# Patient Record
Sex: Female | Born: 1950 | Hispanic: No | Smoking: Former smoker
Health system: Southern US, Community
[De-identification: ages and names within clinical notes are randomized; demographics above are authoritative.]

## PROBLEM LIST (undated history)

## (undated) DIAGNOSIS — R0602 Shortness of breath: Secondary | ICD-10-CM

## (undated) DIAGNOSIS — F32A Depression, unspecified: Secondary | ICD-10-CM

## (undated) DIAGNOSIS — F329 Major depressive disorder, single episode, unspecified: Secondary | ICD-10-CM

## (undated) DIAGNOSIS — R030 Elevated blood-pressure reading, without diagnosis of hypertension: Secondary | ICD-10-CM

## (undated) DIAGNOSIS — Z972 Presence of dental prosthetic device (complete) (partial): Secondary | ICD-10-CM

## (undated) DIAGNOSIS — K219 Gastro-esophageal reflux disease without esophagitis: Secondary | ICD-10-CM

## (undated) DIAGNOSIS — IMO0001 Reserved for inherently not codable concepts without codable children: Secondary | ICD-10-CM

## (undated) DIAGNOSIS — Z973 Presence of spectacles and contact lenses: Secondary | ICD-10-CM

## (undated) DIAGNOSIS — D219 Benign neoplasm of connective and other soft tissue, unspecified: Secondary | ICD-10-CM

## (undated) DIAGNOSIS — J029 Acute pharyngitis, unspecified: Secondary | ICD-10-CM

## (undated) DIAGNOSIS — R21 Rash and other nonspecific skin eruption: Secondary | ICD-10-CM

## (undated) DIAGNOSIS — K635 Polyp of colon: Secondary | ICD-10-CM

## (undated) DIAGNOSIS — M1990 Unspecified osteoarthritis, unspecified site: Secondary | ICD-10-CM

## (undated) DIAGNOSIS — M8580 Other specified disorders of bone density and structure, unspecified site: Secondary | ICD-10-CM

## (undated) DIAGNOSIS — D649 Anemia, unspecified: Secondary | ICD-10-CM

## (undated) DIAGNOSIS — E119 Type 2 diabetes mellitus without complications: Secondary | ICD-10-CM

## (undated) DIAGNOSIS — I10 Essential (primary) hypertension: Secondary | ICD-10-CM

## (undated) HISTORY — DX: Benign neoplasm of connective and other soft tissue, unspecified: D21.9

## (undated) HISTORY — DX: Shortness of breath: R06.02

## (undated) HISTORY — DX: Presence of spectacles and contact lenses: Z97.3

## (undated) HISTORY — DX: Presence of dental prosthetic device (complete) (partial): Z97.2

## (undated) HISTORY — DX: Unspecified osteoarthritis, unspecified site: M19.90

## (undated) HISTORY — DX: Depression, unspecified: F32.A

## (undated) HISTORY — PX: ABDOMINAL HYSTERECTOMY: SHX81

## (undated) HISTORY — DX: Acute pharyngitis, unspecified: J02.9

## (undated) HISTORY — DX: Rash and other nonspecific skin eruption: R21

## (undated) HISTORY — DX: Essential (primary) hypertension: I10

## (undated) HISTORY — DX: Polyp of colon: K63.5

## (undated) HISTORY — DX: Elevated blood-pressure reading, without diagnosis of hypertension: R03.0

## (undated) HISTORY — DX: Reserved for inherently not codable concepts without codable children: IMO0001

## (undated) HISTORY — DX: Gastro-esophageal reflux disease without esophagitis: K21.9

## (undated) HISTORY — DX: Anemia, unspecified: D64.9

## (undated) HISTORY — DX: Other specified disorders of bone density and structure, unspecified site: M85.80

## (undated) HISTORY — DX: Major depressive disorder, single episode, unspecified: F32.9

## (undated) HISTORY — PX: ENDOMETRIAL ABLATION: SHX621

---

## 1998-10-26 ENCOUNTER — Encounter: Payer: Self-pay | Admitting: Emergency Medicine

## 1998-10-26 ENCOUNTER — Emergency Department (HOSPITAL_COMMUNITY): Admission: EM | Admit: 1998-10-26 | Discharge: 1998-10-27 | Payer: Self-pay | Admitting: Emergency Medicine

## 1999-09-22 ENCOUNTER — Other Ambulatory Visit: Admission: RE | Admit: 1999-09-22 | Discharge: 1999-09-22 | Payer: Self-pay | Admitting: Gynecology

## 1999-09-22 ENCOUNTER — Encounter (INDEPENDENT_AMBULATORY_CARE_PROVIDER_SITE_OTHER): Payer: Self-pay | Admitting: Specialist

## 2000-01-22 ENCOUNTER — Ambulatory Visit (HOSPITAL_COMMUNITY): Admission: RE | Admit: 2000-01-22 | Discharge: 2000-01-22 | Payer: Self-pay | Admitting: Gynecology

## 2000-01-22 ENCOUNTER — Encounter (INDEPENDENT_AMBULATORY_CARE_PROVIDER_SITE_OTHER): Payer: Self-pay | Admitting: Specialist

## 2000-11-02 ENCOUNTER — Other Ambulatory Visit: Admission: RE | Admit: 2000-11-02 | Discharge: 2000-11-02 | Payer: Self-pay | Admitting: Gynecology

## 2002-06-14 ENCOUNTER — Inpatient Hospital Stay (HOSPITAL_COMMUNITY): Admission: AD | Admit: 2002-06-14 | Discharge: 2002-06-15 | Payer: Self-pay | Admitting: Gynecology

## 2002-06-14 ENCOUNTER — Other Ambulatory Visit: Admission: RE | Admit: 2002-06-14 | Discharge: 2002-06-14 | Payer: Self-pay | Admitting: Gynecology

## 2002-06-15 ENCOUNTER — Encounter (INDEPENDENT_AMBULATORY_CARE_PROVIDER_SITE_OTHER): Payer: Self-pay

## 2002-06-28 ENCOUNTER — Encounter (INDEPENDENT_AMBULATORY_CARE_PROVIDER_SITE_OTHER): Payer: Self-pay | Admitting: Specialist

## 2002-06-28 ENCOUNTER — Inpatient Hospital Stay (HOSPITAL_COMMUNITY): Admission: RE | Admit: 2002-06-28 | Discharge: 2002-07-01 | Payer: Self-pay | Admitting: Gynecology

## 2003-07-24 ENCOUNTER — Encounter: Admission: RE | Admit: 2003-07-24 | Discharge: 2003-07-24 | Payer: Self-pay | Admitting: Gastroenterology

## 2003-07-24 DIAGNOSIS — K635 Polyp of colon: Secondary | ICD-10-CM

## 2003-07-24 HISTORY — DX: Polyp of colon: K63.5

## 2003-08-14 ENCOUNTER — Other Ambulatory Visit: Admission: RE | Admit: 2003-08-14 | Discharge: 2003-08-14 | Payer: Self-pay | Admitting: Gynecology

## 2003-08-15 ENCOUNTER — Encounter (INDEPENDENT_AMBULATORY_CARE_PROVIDER_SITE_OTHER): Payer: Self-pay | Admitting: Specialist

## 2003-08-15 ENCOUNTER — Ambulatory Visit (HOSPITAL_COMMUNITY): Admission: RE | Admit: 2003-08-15 | Discharge: 2003-08-15 | Payer: Self-pay | Admitting: Gastroenterology

## 2004-08-20 ENCOUNTER — Other Ambulatory Visit: Admission: RE | Admit: 2004-08-20 | Discharge: 2004-08-20 | Payer: Self-pay | Admitting: Gynecology

## 2005-11-03 ENCOUNTER — Other Ambulatory Visit: Admission: RE | Admit: 2005-11-03 | Discharge: 2005-11-03 | Payer: Self-pay | Admitting: Gynecology

## 2006-11-07 ENCOUNTER — Other Ambulatory Visit: Admission: RE | Admit: 2006-11-07 | Discharge: 2006-11-07 | Payer: Self-pay | Admitting: Gynecology

## 2007-11-13 ENCOUNTER — Encounter: Payer: Self-pay | Admitting: Gynecology

## 2007-11-13 ENCOUNTER — Other Ambulatory Visit: Admission: RE | Admit: 2007-11-13 | Discharge: 2007-11-13 | Payer: Self-pay | Admitting: Gynecology

## 2007-11-13 ENCOUNTER — Ambulatory Visit: Payer: Self-pay | Admitting: Gynecology

## 2007-12-11 ENCOUNTER — Ambulatory Visit: Payer: Self-pay | Admitting: Gynecology

## 2008-11-13 ENCOUNTER — Other Ambulatory Visit: Admission: RE | Admit: 2008-11-13 | Discharge: 2008-11-13 | Payer: Self-pay | Admitting: Gynecology

## 2008-11-13 ENCOUNTER — Encounter: Payer: Self-pay | Admitting: Gynecology

## 2008-11-13 ENCOUNTER — Ambulatory Visit: Payer: Self-pay | Admitting: Gynecology

## 2008-12-03 ENCOUNTER — Encounter: Admission: RE | Admit: 2008-12-03 | Discharge: 2008-12-03 | Payer: Self-pay | Admitting: Gynecology

## 2008-12-20 ENCOUNTER — Encounter: Admission: RE | Admit: 2008-12-20 | Discharge: 2008-12-20 | Payer: Self-pay | Admitting: Gastroenterology

## 2009-01-24 ENCOUNTER — Ambulatory Visit: Payer: Self-pay | Admitting: Gynecology

## 2009-02-13 ENCOUNTER — Ambulatory Visit: Payer: Self-pay | Admitting: Gynecology

## 2009-04-30 ENCOUNTER — Ambulatory Visit: Payer: Self-pay | Admitting: Gynecology

## 2009-05-05 ENCOUNTER — Ambulatory Visit: Payer: Self-pay | Admitting: Gynecology

## 2009-05-16 ENCOUNTER — Ambulatory Visit: Payer: Self-pay | Admitting: Gynecology

## 2010-03-20 ENCOUNTER — Other Ambulatory Visit (HOSPITAL_COMMUNITY)
Admission: RE | Admit: 2010-03-20 | Discharge: 2010-03-20 | Disposition: A | Discharge status: Home / Self Care | Payer: 59 | Source: Ambulatory Visit | Attending: Gynecology | Admitting: Gynecology

## 2010-03-20 ENCOUNTER — Other Ambulatory Visit: Payer: Self-pay | Admitting: Gynecology

## 2010-03-20 ENCOUNTER — Ambulatory Visit
Admission: RE | Admit: 2010-03-20 | Discharge: 2010-03-20 | Payer: Self-pay | Source: Home / Self Care | Attending: Gynecology | Admitting: Gynecology

## 2010-03-20 DIAGNOSIS — Z124 Encounter for screening for malignant neoplasm of cervix: Secondary | ICD-10-CM | POA: Insufficient documentation

## 2010-07-10 NOTE — Op Note (Signed)
[facility]  Patient:    Kelsey [name], Kelsey [name]                        MRN: [id number] Proc. Date: 01/22/00 Attending:  Gaetano Hawthorne. Lily Peer, M.D.                           Operative Report  PREOPERATIVE DIAGNOSES:       1. Menorrhagia.                               2. Iron-deficiency anemia.  POSTOPERATIVE DIAGNOSES:      1. Menorrhagia.                               2. Iron-deficiency anemia.  OPERATION:                    1. Endomyoresection intrauterine cavity.                               2. Endometrial ablation.  SURGEON:                      Juan H. Lily Peer, M.D.  ASSISTANT:  ANESTHESIA:                   General endotracheal anesthesia.  INDICATIONS:                  A 60 year old gravida 2, para 2 with menorrhagia and iron-deficiency anemia.  Preoperative evaluation to include benign endometrial biopsy, sonohistogram, and Pap smear; all normal.  The patient was not interested in sterilization. She stated that she is 59 years of age and has felt comfortable with her mode of contraceptive and was not wanting to undergo and take the risk of a laparoscopic surgery and was made aware that this is not any form of contraception per se. She could conceivably get pregnant or not at all.  FINDINGS:                     Normal fluff endometrium with normal-appearing tubal ostia and normal endocervical canal.  DESCRIPTION OF PROCEDURE:     After the patient was adequately counseled, she was taken to the operating room where she was placed in the lower lithotomy position.  The vagina and perineum were prepped and draped in the usual sterile fashion.  A red rubber Roxan Hockey was utilized to evacuate the bladder contents for approximately 50 cc.  The patient had a weighted speculum placed in the vagina. A single-tooth tenaculum was placed into the anterior cervical lip.  Of note, a laminaria that was placed the night before was removed, thus requiring little  cervical dilatation.  The [facility] operative resectoscope with a 90 degree wire loop was inserted into the intrauterine cavity and 3% Sorbitol was utilized as the distending medium and the [facility] generator was utilized with cutting mode of 80 watts and coagulation mode of 80 watts.  In a systematic fashion, endometrial strips were excised to the level of the myometrium in a circular fashion and the strips were passed off and submitted for histological evaluation.  This was done to the level of the internal os.  Once  this was completed, the 90 degree wire loop was removed and exchanged for a roller bar and the entire uterine cavity was ablated to a depth of 2 to 3 mm.  This was accomplished by ablating the entire endometrial cavity up to the internal cervical os.  Pre- and post procedure pictures were obtained.  A copy will be kept at [facility] and a second set at [facility] office in the patients record.  Fluid deficit was 90 cc and the patient received approximately 1300 cc of lactated Ringers.  She received a gram of Cefotan for prophylaxis.  She was transferred to the recovery room with stable vital signs. DD:  01/22/00 TD:  01/22/00 Job: [id number]

## 2010-07-10 NOTE — Discharge Summary (Signed)
   [name], Kelsey [name]                           ACCOUNT NO.:  [id number]   MEDICAL RECORD NO.:  [id number]                   PATIENT TYPE:  INP   LOCATION:  9124                                 FACILITY:  [facility]   PHYSICIAN:  Juan H. Lily Peer, M.D.             DATE OF BIRTH:  [birth year]   DATE OF ADMISSION:  06/28/2002  DATE OF DISCHARGE:  07/01/2002                                 DISCHARGE SUMMARY   DISCHARGE DIAGNOSES:  1. Leiomyomatous uteri.  2. Menometrorrhagia.  3. Anemia.  4. Pelvic pain.  5. Pelvic adhesions.  6. Status post total abdominal hysterectomy with bilateral salpingo-     oophorectomy.  7. Pelvic adenolysis by Gaetano Hawthorne. Lily Peer, M.D. on Jun 28, 2002.   HISTORY:  The patient is a 60 year old female gravida 2, para 2 with a  history of severe menorrhagia and symptomatic anemia to the point actually  was admitted for IV Premarin and emergency D&C and transfusion of 2 units of  packed red blood cells on June 14, 2002.  She was discharged on Megace and  to the point that her hemoglobin could be sufficient enough to perform  definitive surgery.  Therefore, patient was admitted.   HOSPITAL COURSE:  On Jun 28, 2002 patient was admitted.  Underwent a total  abdominal hysterectomy, bilateral salpingo-oophorectomy with pelvic  adenolysis by Gaetano Hawthorne. Lily Peer, M.D. without complications.  Postoperatively the patient remained afebrile, voiding, stable condition.  She was discharged to home on Jul 01, 2002 and given [facility]necology  instructions.   ACCESSORY CLINICAL FINDINGS:  Laboratories:  On Jun 29, 2002 hemoglobin 9.9.   DISPOSITION:  The patient is discharged to home.  Given prescription for  Lortab 7.5/500, Climara 0.1 mg patch q. weekly, Motrin 800 mg p.r.n., Nu-  Iron 150 daily.  She is to follow up in the office in three weeks.  If any  problem prior to that time to be seen in the office.     Susa Loffler, P.A.                    Juan H. Lily Peer,  M.D.    TSG/MEDQ  D:  08/03/2002  T:  08/03/2002  Job:  [id number]

## 2010-07-10 NOTE — H&P (Signed)
Kelsey [name], Kelsey [name]                           ACCOUNT NO.:  [id number]   MEDICAL RECORD NO.:  [id number]                   PATIENT TYPE:  INP   LOCATION:  NA                                   [facility]   PHYSICIAN:  Juan H. Lily Peer, M.D.             DATE OF BIRTH:  September 24, 1950   DATE OF ADMISSION:  DATE OF DISCHARGE:                                HISTORY & PHYSICAL   NOTE:  The patient is scheduled for surgery tomorrow, May 06th at 12:30 here  at [facility].   CHIEF COMPLAINT:  1. Menometrorrhagia.  2. Iron-deficiency anemia.  3. Leiomyomatous uteri.   HISTORY:  The patient is a 60 year old gravida 2, para 2 who has been  complaining for several months of severe menometrorrhagia and as a result  has had symptomatic anemia; and, ultrasounds and exams demonstrate  leiomyomatous uteri.  The patient has had an endomyoresection and  endometrial ablation back in November 2002 secondary to menometrorrhagia.  She has been on iron supplementation all through that time.  At the time of  her annual examination she was found to have an irregular size uterus,  approximately eight to 10 weeks size.  Her ultrasound in 2002 demonstrated a  uterus measuring 10.8 x 5.4 x 6.7 cm; unable to identify the uterine cavity,  but there was a 23 x 14 mm cystic solid mass, intramural myomas measuring 27  x 23 mm and 15 x 10 mm respectively, and subserosal myomas measuring 27 x 27  mm, 18 x 17 mm and 20 x 19 mm respectively.  There was a heterogenous mass  in the area of the endometrial cavity 1 mm versus 13.6 mm with increased  Doppler.  The right ovary had a complex cystic mass measuring 23 x 16 x 16  mm and thick-walled.  The left ovary was normal and there was minimal fluid  in the cul-de-sac.  At the time of the endomyoresection the pathology report  came back benign endometrium with basilar endometrium and no hyperplasia or  malignancy identified.   The patient had been  moving back and forth from her home country in [place]  to take care of a family situation and she was not seen back in the office  until March 09th of this year.  She has been complaining of being tired and  fatigued with menometrorrhagia, lack of energy, her periods being very, very  heavy, and she complains of vasomotor symptoms.  She has had prior anemia  and workup has demonstrated it to be iron-deficiency in nature.  Her uterus  continues to be somewhat enlarged now, measuring 10-12 weeks size.  A CBC  that was done on March 09th demonstrated a hemoglobin of 9.6 with a platelet  count of 314,000.  Her prolactin, FSH and TSH have been reportedly normal.   We had discussed that based on her age she would benefit  from proceeding  with a hysterectomy.  She had been placed on Megace 20 mg b.i.d. along with  iron tablets b.i.d. in and effort to stop her bleeding and get her  hemoglobin to a safe range before proceeding with abdominal hysterectomy.  The patient has had previous cesarean sections in the past.   The uterus most recently by ultrasound demonstrated hat it is now 11.6 x 7.2  x 7.4 cm and multiple myomata as described above.  The endometrium was  difficult to identify, except for from the myometrium.  The right ovary had  a thin-walled echo-free cyst.  The previous complex right ovarian cyst was  no longer present.  The left ovary had an echo-free cystic mass seen  transvaginally measuring 17 x 15 x 17 mm.  The cul-de-sac was negative.  An  attempt in the office to proceed with a vaporization was unsuccessful due to  the discomfort level the patient experienced and only an endometrial biopsy  was obtained.  At endometrial biopsy in the office, although somewhat  limited, demonstrated secretory endometrium with features of exogenous  progestational affect associated with degenerative changes consistent with  breakdown bleeding, but no hyperplasia or malignancy was identified.   Due to  he fact her hemoglobin was low and she was hypotensive, had lightheadedness  and was fatigued it was decided to admit her to the hospital, which she was  on April 22nd whereby she received Premarin IV and received two units of  packed red blood cells.  The following morning she underwent a D&E of which  the path report demonstrated endometrium with exogenous progestational  affect associated with extensive degenerative changes consistent with  breakthrough bleeding.  The changes were also described a suggesting benign  endometrial polyps as well.  The patient was released home on her Megace and  iron tablets, and scheduled for definitive surgery such as a hysterectomy,  which is scheduled for tomorrow, May 06th at 12:30 P.M.   PAST MEDICAL HISTORY:  The patient has anemia.  She takes calcium 1200-1500  mg daily.  She takes iron tablets 1 tablet twice a day after a blood  transfusion.  Of note, she had a hemoglobin of 11.3 and a hematocrit of  34.8, and platelet count of 314,000.  She also has a history of  endomyoresection and endometrial ablation in 2001 for menometrorrhagia.  The  patient has had previous cesarean sections and some form of nasal  reconstructive surgery.   ALLERGIES:  The patient denies any allergies.   FAMILY HISTORY:  Father with hypertension.  Mother with cardiovascular  disease.   SOCIAL HISTORY:  The patient does smoke approximately a half pack of  cigarettes daily.   PHYSICAL EXAMINATION:  GENERAL:  Well-developed, well-nourished female.  HEENT:  Unremarkable with the exception of the sclerae, which appeared to be  pale in nature before her admission to the hospital and before her blood  transfusion.  LUNGS:  Clear to auscultation without rhonchi or wheezes.  HEART:  Regular rate and rhythm at approximately 90 beats per minute.  BREAST EXAMINATION:  No palpable mass or tenderness. LYMPH NODES: No supraclavicular or axillary lymphadenopathy.   ABDOMEN: Soft, nontender without rebound or guarding.  PELVIC:  Bartholin, urethra and Skene glands within normal limits. Blood is  noted to be present in the vaginal vault extruding through the cervical os  when she was admitted prior to the [facility].  Uterus is approximately 10-12 weeks  size, irregularly shaped.  RECTAL EXAMINATION:  No done.   ASSESSMENT:  [age]-year-old gravida 2 para 2 with longstanding history  of menometrorrhagia having had a previous endomyoresection in 2001 who  suffers from anemia, enlarging uterus attributed to leiomyomata uteri with  benign endometrial biopsy suspicious for endometrial polyps along with  leiomyoma.  The patient has been on Megace 20 mg b.i.d. along with iron  supplementation b.i.d.  She recently underwent Premarin intravenously as  well as transfusion of two units of packed red blood cells on April 22nd,  subsequently she underwent D&C to temporarily stop her bleeding in an effort  to prepare herself in order to get her hemoglobin at an adequate level to  proceed with a hysterectomy.   The patient was counseled for a total abdominal hysterectomy with bilateral  salpingo-oophorectomy.  Risks, benefits, pros and cons of the operation were  discussed including infection, bleeding and trauma to internal organs.  For  DVT prophylaxis she will have pneumatic compression stockings.  To prevent  infection she will receive prophylactic antibiotic.  In the event of  uncontrollable hemorrhage the patient is aware in that event she will need a  blood transfusion.  With this she is at an increased risk for anaphylactic  reaction, hepatitis and/or AIDS.  Also, there is a risk for trauma to  internal organs, i.e. the bladder, intestines and blood vessels, and nerves  and nearby structures, for which she may need corrective surgery.  There is  a remote possibility that this could be a leiomyosarcoma, the final path  report will determine this or if there is  any malignancy, and at that point  she will need to be referred to a Gyn oncologist for more definitive  surgery.   All these issues were discussed with the patient in Spanish.  She is fully  aware, also, that she will need to be placed on hormone replacement therapy.  Again, all the above was discussed with the patient in Spanish and all  questions were answered, and we will follow accordingly.                                                 Juan H. Lily Peer, M.D.    JHF/MEDQ  D:  06/27/2002  T:  06/28/2002  Job:  [id number]

## 2010-07-10 NOTE — H&P (Signed)
[facility]  Patient:    Kelsey [name], Kelsey [name]                        MRN: [id number] Adm. Date:  01/21/00 Attending:  Gaetano Hawthorne. Lily Peer, M.D.                         History and Physical  CHIEF COMPLAINT:              1. Menorrhagia.                               2. Iron deficiency anemia related to #1.  HISTORY OF PRESENT ILLNESS:   The patient is a 60 year old gravida 2, para 2 not using any form of contraception with the exception that her husband is using condoms and she has been using this method for 7 years which has been affective for her. She was seen on June 10 for her annual exam this year and was complaining of menorrhagia. She also was noted to have iron deficiency anemia. Last year her hemoglobin and hematocrit were 9.4 and 29.5 respectively and after anemia workup identified that it was iron deficiency was placed on supplemental iron 1 tablet twice daily. The day of her visit on June 10, her hemoglobin and hematocrit were 10.0 and 30.5 respectively. Her platelet count 328,000. The patient underwent a sonohysterogram and endometrial biopsy on July 31 which demonstrated a benign endometrium. The sonohysterogram demonstrated a uterus measuring 10.3 x 5.9 x 7.5 cm with an endometrial stripe of 4.3. mm. She had multiple intramural fibroids less than 20 mm in size. Both ovaries were normal size and the cul-de-sac was negative for any fluid. Saline infusion was done and a sonohysterogram was negative. No intracavitary defects were seen. The patient had been presented with various options, due to the fact that she is 60 years of age that she may want to consider having an endometrial oblation and concurrently a tubal sterilization procedure. When she was seen in the office today, November 29, she stated that she would not like to have a laparoscopic sterilization procedure and that her form of contraception, condoms, has worked well for the past 7  years and since she is approaching menopause she would not like to have anything else done but the endometrial oblation. If this were to fail in the future, she stated that she would consider a hysterectomy. In an effort to suppress her bleeding and to improve her hemoglobin, she was placed in a ______ analog such as Lupron 3.75 mg for a 3 month period and also the addition of Megace last month to stop all her bleeding all together. CBC is pending at the time of this dictation as part of her preop laboratory. She was seen in the office today, November 29, whereby she had a laminary placed intracervically to facilitate the insertion of the cystoscope the day of her surgery.  PAST MEDICAL HISTORY:         The patient is a chronic smoker. She had a chest x-ray PA and lateral last year which was negative. She also had a bone density study last year which was also normal as well as her mammogram. Her menarche was at the age of 25. She has had 2 prior cesarean sections in her native country of [place] and she has had some form of nose reconstructive  surgery. She is a heavy smoker. She drinks caffeine regularly. Her sister has a history of heart disease and her father has a history of hypertension. She has 4 brothers and 4 sisters.  PHYSICAL EXAMINATION:  GENERAL:                      Well-developed, well-nourished female.  HEENT:                        Unremarkable.  NECK:                         Supple. Trachea midline. No carotid bruits, no thyromegaly.  LUNGS:                        Clear to auscultation without rhonchi or wheezes. HEART:                        Regular rate and rhythm, no murmur or gallops.  BREASTS:                      Done at the time of her annual exam on June 10 of this year was normal.  ABDOMEN:                      Soft, nontender without rebound or guarding.  PELVIC:                       Bartholins, urethra and Skene glands within normal limits. Vagina and  cervix no gross lesion on inspection. Uterus 8-10 week size, irregular. Adnexa no mass or tenderness.  RECTAL:                       Unremarkable.  Hemoccult negative.  ASSESSMENT:                   A 60 year old gravida 2, para 2 with menorrhagia and iron deficiency anemia was placed on ______ analog Lupron 3.75 mg for 3 months in an effort to suppress uterine stimulation and improve the patients hemoglobin. She was also on supplemental iron 1 tablet twice daily. She was seen in the office on November 29 where the laminary was placed intracervically to be removed the day of her surgery to facilitate hysteroscopic exertion. Once again we discussed the risks, benefits, pros and cons of the procedure to include infection, bleeding, trauma to internal organs, perforation of the uterus requiring open exploration for corrective surgery, also the risk of fluid overload, pulmonary embolism and even death. Also in the event of hemorrhage if she were need to be a blood transfusion, potential risk or anaphylactic reaction, hepatitis and aids. We discussed that endometrial oblation cannot guarantee contraception nor fertility that individuals usually have this procedure their spouses had a vasectomy or the patient would have tubal sterilization procedure. Initially, she had thought about having it done but she states today on preoperative consultation that she has had a method that works well for her "for 7 years" with the condoms and she is approaching menopause and she does not want to take the chance of any intra-abdominal trauma risk with laparoscopic technique as she has had 2 cesarean sections in the past. The patient was made fully aware of the potential risk and perhaps the underlying malignancy which was  not identified preoperatively but could manifest later in life. Also the possibility of needing to repeat this procedure several years down the line. All of this was explained to the  patient and her husband in Spanish and literature previously had been provided. All questions were answered and will follow accordingly. Of note, as part of her workup also she has had a TSH and prolactin recently this  year which was also normal.  PLAN:                         The patient is scheduled for endometrial oblation on the morning of November 30 in [facility].    DD: 01/21/00 TD:  01/22/00 Job: [id number]

## 2010-07-10 NOTE — Discharge Summary (Signed)
Kelsey [name], Kelsey [name]                           ACCOUNT NO.:  [id number]   MEDICAL RECORD NO.:  [id number]                   PATIENT TYPE:  INP   LOCATION:  9132                                 FACILITY:  [facility]   PHYSICIAN:  Juan H. Lily Peer, M.D.             DATE OF BIRTH:  Oct 18, 1950   DATE OF ADMISSION:  06/14/2002  DATE OF DISCHARGE:  06/15/2002                                 DISCHARGE SUMMARY   DISCHARGE DIAGNOSES:  1. Severe menometrorrhagia.  2. Symptomatic anemia.  3. Leiomyomata uteri.  4. Status post transfusion of 2 units of packed red blood cells secondary to     severe anemia.  5. Emergency dilatation and curettage by Gaetano Hawthorne. Lily Peer, M.D. on June 14, 2002.   HISTORY:  This is a 60 year old female gravida 2, para 2 with a history of  endometrial ablation November of 2001 secondary to menometrorrhagia.  She  was known to have a fibroid uterus.  Was not seen, however, for check/annual  examination from 2002 until March 2004.  Hemoglobin was demonstrated to be  9.6 at that time and she was placed on Megace 20 mg b.i.d. in effort to stop  her bleeding and to take iron b.i.d. in effort to get her iron level in safe  range to proceed with hysterectomy.  She returned to the office on June 13, 2002.  Uterus measured 11.6 x 7.2 x 7.4 cm with multiple myomas.  Iron level  decreased to 8.9.  __________ was attempted but unfortunately unable to be  successful secondary to discomfort so a __________ was ordered.  Due to the  fact the patient was lightheaded, symptomatic, chronic fatigue, and the  amount of bleeding she was having on the day of admission, she was admitted  to the hospital for Premarin IV and transfusion of 2 packed RBCs in  preparation for in the a.m. to a D&E.   HOSPITAL COURSE:  On June 14, 2002, patient was admitted with a diagnosis  of leiomyomatous uteri, menorrhagia, severe anemia secondary to same.  Was  begun on IV Premarin and was transfused 2  units of packed RBCs secondary to  severe anemia that she had.  She had been kept n.p.o. from admission and on  June 15, 2002, patient underwent an emergency dilatation and curettage  without complications and subsequently the bleeding was decreased.  Therefore, patient was felt stable for discharge.   ACCESSORY CLINICAL FINDINGS:  Laboratories:  On pathology endometrioma was  with exogenous progesterone effect __________ degenerative change consistent  with __________ benign endometrial polyp.  On June 15, 2002, at 0510  hemoglobin 11.3.   DISPOSITION:  The patient is discharged to home.  She is to continue Megace  20 mg b.i.d. and iron b.i.d.  She is to schedule TAH/BSO in the next one to  three weeks.     Susa Loffler, P.A.  Juan H. Lily Peer, M.D.    TSG/MEDQ  D:  08/03/2002  T:  08/03/2002  Job:  [id number]

## 2010-07-10 NOTE — H&P (Signed)
[name], [name]                           ACCOUNT NO.:  [id number]   MEDICAL RECORD NO.:  [id number]                   PATIENT TYPE:  INP   LOCATION:  9132                                 [facility]ACILITY:  [facility]   PHYSICIAN:  Juan H. Lily Peer, M.D.             DATE OF BIRTH:  1950/03/18   DATE OF ADMISSION:  06/14/2002  DATE OF DISCHARGE:                                HISTORY & PHYSICAL   CHIEF COMPLAINT:  1. Severe menometorrhagia.  2. Symptomatic anemia.  3. Leiomyomata uteri.   HISTORY:  The patient is a 60 year old gravida 2, para 2 who was last seen  for annual gynecological examination in 2002.  She has had history in the  past of endomyal resection, endometrial ablation back in 11/01 secondary to  menometorrhagia.  She had been on iron supplementation all through that  time.  At the time of her annual exam she was found to have an irregular  sized uterus, approximately 8-10 weeks size.  Her ultrasound that was done  in 2002 demonstrated a uterus measuring 10.8 x 5.4 x 6.7 cm, unable to  identify the uterine cavity but there was a 23 x 14 mm cystic solid mass,  intramural myomas measuring 27 x 23 mm and 15 x 10 mm respectively, with  subserosal myomas measuring 27 x 27 mm, 18 x 17 mm and 20 x 19 mm.  Heterogenous mass in the area of the endometrial cavity, 1 mm versus 13.6 mm  with increased Doppler.  Her right ovary had a complex cystic mass measuring  23 x 16 x 16 mm thick wall, the left ovary was normal and there was minimal  fluid in the cul-de-sac.  At that time of the endomyal ablation, she had an  endomyal resection on 01/22/00 with benign endometrium and basilar  endometrium and no hyperplasia malignancy was identified.  The patient had  been moving back and forth from here to [place] to take care of situations  at home and we did not see her until most recently which was 05/01/02.  She  has had multiple complaints, being tired and fatigued with irregular periods  and  lack of energy.  Her periods are very, very heavy and she had complaint  of visual motor symptoms.  She has had a prior anemia work and she had iron-  deficiency anemia.  Her uterus was somewhat enlarged measuring 10-12 weeks  size and a CBC that was done on 05/01/02 had demonstrated a hemoglobin of 9.6  with platelet count 314,000, prolactin, FSH and TSH were normal.  We had  discussed that based on her age that she would benefit from proceeding then  with a hysterectomy.  She had been placed on Megace 20 mg b.i.d. in an  effort to stop her bleeding, continue to take iron tablets one twice a day  in effort to get her iron level to a safe range  before we proceeded with a  hysterectomy.  She was asked to return to the office for an ultrasound which  she did yesterday.  The uterus measured 11.6 x 7.2 x 7.4 cm with multiple  myomas as described before.  The endometrium was difficult to identify  separate from the myometrium.  The right ovary had a thin wall echo free  cyst.  The previous complex right ovarian cyst was no longer present.  The  left ovary had an echo free cystic mass seen transvaginally measuring 17 x  15 x 17 mm.  The cul-de-sac was negative.  An attempt was made in the office  but she was bleeding today significantly, and her iron level today was noted  to have decreased to a level of 8.9, a vapor aspiration was attempted  without creating much discomfort for the patient so a Pipelle was utilized  and tissue was submitted for histological evaluation.  Due to the fact that  she was light headed, symptomatic, crying, fatigued and the amount of  bleeding she was having today, she is going to be admitted to the hospital  for Premarin intravenously and for a blood transfusion for two units of  packed red blood cells and preparation to follow for tomorrow morning to  proceed with a D&E and schedule for hysterectomy some time in the next week  if she were to continue to be stable.    PAST MEDICAL HISTORY:  1. Anemia.  She takes calcium 1200 to 1500 mg daily.  She is on iron tablets     one tablet twice a day.  2. She has history of endomyal resection and endometrial ablation in 2001     for menometrorrhagia.  She has had two previous cesarean sections and has     had some form of nasal reconstructive surgery.   FAMILY HISTORY:  A father with hypertension, mother with cardiovascular  disease.   SOCIAL HISTORY:  She does smoke approximately half a pack of cigarettes  daily.   PHYSICAL EXAMINATION:  GENERAL: Pale appearing female.  HEENT: Unremarkable with the exception that the sclerae was much paler.  LUNGS: Clear to auscultation without rhonchi or wheezes.  HEART: Regular rate and rhythm, heart rate approximately 90 beats per  minute.  BREAST EXAM: No palpable mass or tenderness.  LYMPH NODES: No supraclavicular or axillary lymphadenopathy.  ABDOMEN: Soft, nontender without rebound or guarding.  PELVIC: Bartholin, urethral skin glans within normal limits.  Blood present  in the vaginal vault and extruding through the cervical os.  Uterus  approximately 10 to 12 weeks size, irregular shape.  RECTAL EXAM: Not done.   ASSESSMENT:  60 year old gravida 2, para 2 with long-standing history of  menometorrhagia despite having had an endomyal resection and endometrial  ablation in 2001.  She has been on supplemental iron and now had been on  Megace which she has taken sometimes not two tablets a days as previously  prescribed because she gets pruritus, but at times will just take one.  Her  hemoglobin has dropped to a level of 8.9 for which she is symptomatic.  Her  platelet count was normal.   PLAN:  Admit her to the hospital for intravenous Premarin 25 mg IV q.4h for  four to six doses.  Will also transfuse her two units of packed red blood  cells and plan on doing a more vigorous vapor aspiration/DNE in the operating room and allow her hemoglobin to stabilize and  plan for a  total  abdominal hysterectomy and bilateral salpingo-oophorectomy tomorrow.  The  patient was counseled as to the potential risks from blood transfusion to  include anaphylactic reactions, hepatitis and AIDS and also perforation of  the uterus at the time of the D&E/vapor aspiration.  If the above regimen is  not successful in stopping her bleeding then we may need to emergently  proceed with hysterectomy at the same time as transfusion also.  All these  issues were discussed with the patient and her husband in Spanish and all  questions were answered and will follow accordingly.                                               Juan H. Lily Peer, M.D.    JHF/MEDQ  D:  06/14/2002  T:  06/15/2002  Job:  [id number]

## 2010-07-10 NOTE — Op Note (Signed)
NAMESOBIA, Kelsey [name]                           ACCOUNT NO.:  [id number]   MEDICAL RECORD NO.:  [id number]                   PATIENT TYPE:  INP   LOCATION:  9305                                 [facility]ACILITY:  [facility]   PHYSICIAN:  Juan H. Lily Peer, M.D.             DATE OF BIRTH:  07-Oct-1950   DATE OF PROCEDURE:  06/28/2002  DATE OF DISCHARGE:                                 OPERATIVE REPORT   PREOPERATIVE DIAGNOSES:  1. Symptomatic leiomyomata uteri.  2. Menometrorrhagia.  3. Anemia.  4. Pelvic pain.   POSTOPERATIVE DIAGNOSES:  1. Symptomatic leiomyomata uteri.  2. Menometrorrhagia.  3. Anemia.  4. Pelvic pain.  5. Pelvic adhesions.   PROCEDURE:  Total abdominal hysterectomy with bilateral salpingo-  oophorectomy and pelvic adhesiolysis.   SURGEON:  Juan H. Lily Peer, M.D.   ASSISTANT:  Rande Brunt. Eda Paschal, M.D.   ANESTHESIA:  General endotracheal anesthesia.   INDICATION FOR PROCEDURE:  A 60 year old gravida 2, para 2, with chronic  menometrorrhagia, iron-deficiency anemia, last week had undergone a 24-hour  admission to the hospital whereby she was given Premarin IV and transfused  two units of packed red blood cells, underwent a D&C to control her  bleeding.  She had continued on the Megace twice a day at 20 mg and iron two  tablets daily until today's surgery.  Her preoperative hemoglobin and  hematocrit were 13.2 and 40.6, respectively.   FINDINGS:  Leiomyomata uteri, pelvic adhesions, left ovarian endometriosis,  and a normal right ovary and normal-appearing appendix.   DESCRIPTION OF PROCEDURE:  After the patient was adequately counseled she  was taken to the operating room, where she underwent a successful general  endotracheal anesthesia.  She had received 1 g of Cefotan prophylactically.  A Foley catheter had been inserted in an effort to monitor urinary output  intraoperatively as well as for postoperative monitoring.  The abdomen and  vagina were prepped and  draped in the usual sterile fashion.  The drapes  were in place.  A Pfannenstiel skin incision was made adjacent to the  previous cesarean section scar.  The incision was carried down through the  skin and subcutaneous tissue down to the rectus fascia, whereby a midline  nick was made, the fascia was incised in a transverse fashion, and the  peritoneal cavity was entered.  The patient was placed in slight  Trendelenburg position after the O'Connor-O'Sullivan retractors were in  place.  Inspection of the pelvic cavity demonstrated leiomyomata uteri  whereby she had intramural subserosal leiomyoma evident, the left ovary had  a speckle of endometriosis, the right ovary looked fine, and in the lower  uterine segment there appeared to be some thickening of the bladder from  previous cesarean section.  Both round ligaments were identified and  transected and incised.  The broad ligaments were incised to the level of  the internal cervical os.  Both ureters were inspected  and with the use of  the surgeon's finger, the posterior broad ligament was penetrated and the  right infundibulopelvic ligament was clamped, cut, and free-tied with 0  Vicryl, followed by transfixion stitch.  A similar procedure was carried out  on the contralateral side.  Skeletonization of the peritoneum whereby the  broad and cardinal ligaments were serially clamped, cut, and suture ligated  to a level of both vaginal fornices was accomplished.  Once both angles were  secured, the cervix was excised from the vagina and the cervix, uterus,  tubes, and ovaries were passed off the operative field.  Both angle stitches  were secured with a transfixion stitch of 0 Vicryl suture and the remaining  vaginal cuff was secured with an interrupted figure-of-eight 0 Vicryl  suture.  The pelvic cavity was copiously irrigated with normal saline  solution.  After ascertaining adequate hemostasis the sponge count and  needle count were  correct.  The O'Connor-O'Sullivan retractors and sponges  were removed.  The visceral peritoneum was now reapproximated.  The rectus  fascia was closed with a running stitch of 0 Vicryl suture.  The  subcutaneous layers were Bovie cauterized.  The skin was reapproximated with  skin clips, followed by placement of Xeroform gauze and 4 x 8 dressing.  The  patient was transferred to the recovery room with stable vital signs.  Blood  loss for the procedure was recorded to be 250 mL.  Urine output 450 mL and  clear.  IV fluids:  2 L of lactated Ringer's.  Prophylactic antibiotics  consisted of 1 g of Cefotan IV, and she had pneumatic compression stockings  for DVT prophylaxis during the case as well.                                               Juan H. Lily Peer, M.D.    JHF/MEDQ  D:  06/28/2002  T:  06/29/2002  Job:  [id number]

## 2010-07-10 NOTE — Op Note (Signed)
   [name], Kelsey [name]                           ACCOUNT NO.:  [id number]   MEDICAL RECORD NO.:  [id number]                   PATIENT TYPE:  INP   LOCATION:  9132                                 [facility]   PHYSICIAN:  Juan H. Lily Peer, M.D.             DATE OF BIRTH:  1951/01/07   DATE OF PROCEDURE:  06/14/2002  DATE OF DISCHARGE:                                 OPERATIVE REPORT   SURGEON:  Gaetano Hawthorne. Lily Peer, M.D.   INDICATIONS:  A 60 year old with menometrorrhagia admitted last night  secondary to severe anemia and menorrhagia.  She was started on Premarin IV  25 mg IV q.4h. for four doses and she was transfused 2 units of packed red  blood cells.  Her preoperative hemoglobin was 8.9.  Post transfusion was  11.3.  The patient to undergo D&E and scheduled for hysterectomy in the next  couple of weeks.   PREOPERATIVE DIAGNOSES:  1. Myomatous uteri.  2. Menometrorrhagia.  3. Anemia.   POSTOPERATIVE DIAGNOSES:  1. Myomatous uteri.  2. Menometrorrhagia.  3. Anemia.   ANESTHESIA:  General endotracheal anesthesia.   PROCEDURE PERFORMED:  Emergency dilatation and curettage.   DESCRIPTION OF OPERATION:  After the patient was adequately counseled she  was taken to the operating room where she underwent a successful general  endotracheal anesthesia.  Her bladder was evacuated of its contents.  Vagina  and perineum were prepped and draped in usual sterile fashion.  A Graves  speculum was inserted into the vaginal vault.  The anterior cervical lip was  grasped with a single tooth tenaculum.  Of note, on examination under  anesthesia there was noted to be an 8-10 week sized irregularly-shaped  anteverted uterus.  The cervix was serially dilated and followed by vigorous  curettage in endometrial cavity interchanged with a suction curette.  Curetting submitted for histological evaluation.  The patient was extubated,  transferred to recovery room with stable vital signs.  She did  receive 1 g  of Cefotan for prophylaxis preoperatively.  In-and-out catheter for her  urine preoperatively was approximately 150 mL of urine.  Blood loss was  approximately less than 100 mL.  Fluid resuscitation consisted of 1300 mL of  lactated Ringer's.  The patient received 30 mg of Toradol prior to  extubation and transfer into the recovery room.  When she was transferred  she was with stable vital signs.                                               Juan H. Lily Peer, M.D.    JHF/MEDQ  D:  06/15/2002  T:  06/15/2002  Job:  [id number]

## 2010-07-10 NOTE — Op Note (Signed)
NAME:  Kelsey [name], Kelsey [name]                         ACCOUNT NO.:  [id number]   MEDICAL RECORD NO.:  [id number]                   PATIENT TYPE:  AMB   LOCATION:  ENDO                                 FACILITY:  [facility]   PHYSICIAN:  Graylin Shiver, M.D.                DATE OF BIRTH:  19-May-1950   DATE OF PROCEDURE:  08/15/2003  DATE OF DISCHARGE:                                 OPERATIVE REPORT   PROCEDURE PERFORMED:  Colonoscopy.   INDICATIONS FOR PROCEDURE:  Screening.   Informed consent was obtained after explanation of the risks of bleeding,  infection, and perforation.   PREMEDICATIONS:  Fentanyl 65 mcg  IV, Versed 7.5 mg IV.   DESCRIPTION OF PROCEDURE:  With the patient in the left lateral decubitus  position, a rectal exam was performed and no masses were felt.  The Olympus  colonoscope was inserted into the rectum and advanced around the colon to  the cecum.  Cecal landmarks were identified.  The cecum and ascending colon  were normal.  The transverse colon normal.  The descending colon showed a  small 3 mm polyp biopsied off with cold forceps.  The sigmoid and rectum  looked normal.  The patient tolerated the procedure well without  complications.   IMPRESSION:  Small descending colon polyp.   PLAN:  The pathology will be checked.                                               Graylin Shiver, M.D.    [name]  D:  08/15/2003  T:  08/16/2003  Job:  [id number]   cc:   [name]. [name], M.D.  [address]  [name]  [place] [address]  Fax: [phone]

## 2010-08-25 ENCOUNTER — Other Ambulatory Visit: Payer: Self-pay | Admitting: Family Medicine

## 2010-08-25 DIAGNOSIS — D179 Benign lipomatous neoplasm, unspecified: Secondary | ICD-10-CM

## 2010-09-04 ENCOUNTER — Ambulatory Visit (HOSPITAL_BASED_OUTPATIENT_CLINIC_OR_DEPARTMENT_OTHER)
Admission: RE | Admit: 2010-09-04 | Discharge: 2010-09-04 | Disposition: A | Discharge status: Home / Self Care | Payer: 59 | Source: Ambulatory Visit | Attending: Internal Medicine | Admitting: Internal Medicine

## 2010-09-04 ENCOUNTER — Ambulatory Visit (INDEPENDENT_AMBULATORY_CARE_PROVIDER_SITE_OTHER): Payer: 59 | Admitting: Internal Medicine

## 2010-09-04 ENCOUNTER — Ambulatory Visit
Admission: RE | Admit: 2010-09-04 | Discharge: 2010-09-04 | Disposition: A | Discharge status: Home / Self Care | Payer: 59 | Source: Ambulatory Visit | Attending: Family Medicine | Admitting: Family Medicine

## 2010-09-04 ENCOUNTER — Other Ambulatory Visit: Payer: Self-pay

## 2010-09-04 ENCOUNTER — Encounter: Payer: Self-pay | Admitting: Internal Medicine

## 2010-09-04 DIAGNOSIS — M549 Dorsalgia, unspecified: Secondary | ICD-10-CM | POA: Insufficient documentation

## 2010-09-04 DIAGNOSIS — F17200 Nicotine dependence, unspecified, uncomplicated: Secondary | ICD-10-CM

## 2010-09-04 DIAGNOSIS — D179 Benign lipomatous neoplasm, unspecified: Secondary | ICD-10-CM

## 2010-09-04 DIAGNOSIS — I10 Essential (primary) hypertension: Secondary | ICD-10-CM | POA: Insufficient documentation

## 2010-09-04 DIAGNOSIS — M546 Pain in thoracic spine: Secondary | ICD-10-CM

## 2010-09-04 MED ORDER — CYCLOBENZAPRINE HCL 10 MG PO TABS: 10.0000 mg | ORAL_TABLET | Freq: Two times a day (BID) | ORAL | Status: AC | PRN

## 2010-09-04 NOTE — Assessment & Plan Note (Signed)
L flank mass  x > 1 year, recent u/s favors a benign etiology but a MRI is suggested. Mass is causing dyscomfort thus may benefit from excision Plan:  Surgical referal, MRI? Excision?

## 2010-09-04 NOTE — Progress Notes (Signed)
  Subjective:    Patient ID: Kelsey [name], female    DOB: 03-18-1950, 60 y.o.   MRN: [id number]  HPI New patient Several month history of on and off, right or left, upper back  pain, no radiation. Also for more than a year she has noted a mass at the left flank, up until recently it was asymptomatic but now it causes discomfort. She recently went to the urgent care complaining of upper back pain, she was prescribed prednisone; also a ultrasound was ordered to asses the flank mass.  Past Medical History  Diagnosis Date  . Elevated blood-pressure reading without diagnosis of hypertension    Past Surgical History  Procedure Date  . Abdominal hysterectomy     and oophorectomy---Dr Lily Peer   History   Social History  . Marital Status: Widowed    Spouse Name: N/A    Number of Children: 2  . Years of Education: N/A   Occupational History  .  [employer]   Social History Main Topics  . Smoking status: Current Everyday Smoker -- 0.6 packs/day    Types: Cigarettes  . Smokeless tobacco: Not on file  . Alcohol Use: Yes     rare  . Drug Use: No  . Sexually Active: Not on file   Other Topics Concern  . Not on file   Social History Narrative   Widow 03-2010---   Family History  Problem Relation Age of Onset  . Diabetes Mother   . Hypertension Father   . Emphysema Father   . Esophageal cancer      MGF, 2 uncles   . Barrett's esophagus Mother   . Colon cancer Neg Hx   . Breast cancer Neg Hx    Review of Systems No neck pain per se No upper extremity paresthesias No fever or weight loss recently Denies cough or sputum production. No hemoptysis. She does heavy lifting at work but that doesn't seem to trigger her upper back pain.     Objective:   Physical Exam  Constitutional: She is oriented to person, place, and time. She appears well-developed and well-nourished.  Neck: Normal range of motion. Neck supple.       Neck nontender to palpation of the cervical  spine  Cardiovascular: Normal rate, regular rhythm and normal heart sounds.   No murmur heard. Pulmonary/Chest: Effort normal and breath sounds normal. No respiratory distress. She has no wheezes. She has no rales.  Musculoskeletal: She exhibits no edema.       Upper back not tender to palpation  Neurological: She is alert and oriented to person, place, and time.       Strength symmetric, DTRs symmetric. No cranial nerve deficits.  Skin:     Psychiatric: She has a normal mood and affect. Her behavior is normal. Judgment and thought content normal.          Assessment & Plan:

## 2010-09-04 NOTE — Patient Instructions (Signed)
For back pain: XR Warm compress , motrin 200 mg OTC  2 or 3 tablets every 6 hours as needed, take with food to prevent stomach irritation Also flexeril , will cause drowsines

## 2010-09-04 NOTE — Assessment & Plan Note (Signed)
On-off upper back pain, L or R, not clearly related to motion-position She is a heavy smoker Plan: CXR Treat as a muscle-skeletal pain, will call if no better

## 2010-09-09 ENCOUNTER — Telehealth: Payer: Self-pay | Admitting: *Deleted

## 2010-09-09 NOTE — Telephone Encounter (Signed)
Message left for patient to return my call.  

## 2010-09-09 NOTE — Telephone Encounter (Signed)
Message copied by Leanne Lovely on Wed Sep 09, 2010  9:06 AM ------      Message from: Willow Ora E      Created: Tue Sep 08, 2010  5:54 PM       advise patient, chest x-ray normal

## 2010-09-10 ENCOUNTER — Ambulatory Visit (INDEPENDENT_AMBULATORY_CARE_PROVIDER_SITE_OTHER): Payer: 59 | Admitting: General Surgery

## 2010-09-10 NOTE — Telephone Encounter (Signed)
Message left for patient to return my call.  

## 2010-09-11 NOTE — Telephone Encounter (Signed)
Message left for patient to return my call.  

## 2010-09-15 ENCOUNTER — Encounter: Payer: Self-pay | Admitting: *Deleted

## 2010-09-15 NOTE — Telephone Encounter (Signed)
Will mail pt a letter.

## 2010-09-18 ENCOUNTER — Ambulatory Visit (INDEPENDENT_AMBULATORY_CARE_PROVIDER_SITE_OTHER): Payer: 59 | Admitting: General Surgery

## 2010-10-02 ENCOUNTER — Encounter (INDEPENDENT_AMBULATORY_CARE_PROVIDER_SITE_OTHER): Payer: Self-pay | Admitting: General Surgery

## 2010-10-02 ENCOUNTER — Ambulatory Visit (INDEPENDENT_AMBULATORY_CARE_PROVIDER_SITE_OTHER): Payer: 59 | Admitting: General Surgery

## 2010-10-02 ENCOUNTER — Other Ambulatory Visit (INDEPENDENT_AMBULATORY_CARE_PROVIDER_SITE_OTHER): Payer: Self-pay | Admitting: General Surgery

## 2010-10-02 VITALS — BP 148/96 | HR 68 | Temp 96.9°F | Ht 60.25 in | Wt 140.0 lb

## 2010-10-02 DIAGNOSIS — R1900 Intra-abdominal and pelvic swelling, mass and lump, unspecified site: Secondary | ICD-10-CM

## 2010-10-02 NOTE — Patient Instructions (Signed)
The lump in your left back is probably a benign lipoma. It may be deep within the muscle layers, and so we are going to do an MRI of the lumbar spine with particular attention to this area. You also have lots of pain in your neck and thoracic spine and lumbar spine which may be due to other causes. After we complete the MRI you will return to me to discuss the nature of the lump in your left back. Because of your chronic back pain I suggest she discuss this with Dr. Drue Novel and possibly seek referral to an orthopedic surgeon for an opinion. I do not think the pain in your neck and upper back is related to the lump in your lower back.

## 2010-10-02 NOTE — Progress Notes (Signed)
Subjective:     Patient ID: Kelsey [name], female   DOB: [birth year], 60 y.o.   MRN: [id number]  HPI This is a 60 year old Hispanic female, referred to me by Dr. Porfirio Oar  for evaluation of a palpable mass in the left lumbar area.  The patient states that the palpable mass in the left lumbar area has been there for almost 2 years. It has been painful for the past 2 months. It has not enlarged. There has been no trauma or infection.  She also has loss of neck and back pain which she attributes to the amount of lifting she does for the [employer]. She has never been evaluated for any spine disease, according to her.  A chest ultrasound was performed by Dr. Augusto Gamble in the radiology department on July 13. The palpable abnormality in the left flank low back appears related to the muscular layer and measures 19 mm in diameter. There is no hypervascularity were suspicious shadowing. The radiologist recommended a followup lumbar MRI with special attention to this area in addition to the routine lumbar disc evaluation. This was recommended to be done with and without contrast.  Past Medical History  Diagnosis Date  . Elevated blood-pressure reading without diagnosis of hypertension   . Hypertension   . SOB (shortness of breath)   . Reflux   . Rash   . Wears dentures   . Colon polyp   . Arthritis   . Sore throat   . Wears glasses    Current Outpatient Prescriptions  Medication Sig Dispense Refill  . Cholecalciferol (VITAMIN D3 PO) Take by mouth daily.        . cyclobenzaprine (FLEXERIL) 10 MG tablet Take 10 mg by mouth daily.        . Multiple Vitamins-Minerals (CENTRUM SILVER ULTRA WOMENS PO) Take by mouth daily.        . multivitamin (THERAGRAN) per tablet Take 1 tablet by mouth daily.         Allergies  Allergen Reactions  . Phenergan Anxiety    Makes her body shake.    Family History  Problem Relation Age of Onset  . Diabetes Mother   . Barrett's esophagus Mother   .  Hypertension Father   . Emphysema Father   . Esophageal cancer      MGF, 2 uncles   . Colon cancer Neg Hx   . Breast cancer Neg Hx     History  Substance Use Topics  . Smoking status: Current Everyday Smoker -- 0.6 packs/day    Types: Cigarettes  . Smokeless tobacco: Not on file  . Alcohol Use: Yes     rare      Review of Systems  Constitutional: Negative.   HENT: Positive for neck pain and neck stiffness. Negative for hearing loss, ear pain, nosebleeds, congestion, facial swelling, rhinorrhea, tinnitus and ear discharge.   Eyes: Negative.   Respiratory: Negative.   Cardiovascular: Negative.   Gastrointestinal: Negative.   Genitourinary: Negative.   Musculoskeletal: Positive for myalgias, back pain and arthralgias. Negative for joint swelling and gait problem.  Neurological: Negative.   Hematological: Negative.   Psychiatric/Behavioral: Negative.        Objective:   Physical Exam  Constitutional: She appears well-developed and well-nourished. No distress.  HENT:  Head: Normocephalic and atraumatic.  Mouth/Throat: No oropharyngeal exudate.  Eyes: Conjunctivae and EOM are normal. Pupils are equal, round, and reactive to light. Left eye exhibits no discharge. No scleral icterus.  Neck: Neck supple. No JVD present. No tracheal deviation present. No thyromegaly present.  Cardiovascular: Normal rate, regular rhythm, normal heart sounds and intact distal pulses.   No murmur heard. Pulmonary/Chest: Effort normal and breath sounds normal. No respiratory distress. She has no wheezes. She has no rales. She exhibits no tenderness.  Abdominal: Soft. Bowel sounds are normal. She exhibits no distension and no mass. There is no tenderness. There is no rebound and no guarding.    Musculoskeletal: Normal range of motion. She exhibits no edema and no tenderness.       Arms: Lymphadenopathy:    She has no cervical adenopathy.  Neurological: She is alert. She exhibits normal muscle  tone.  Skin: Skin is dry. No rash noted. No erythema. No pallor.  Psychiatric: She has a normal mood and affect. Her behavior is normal. Judgment and thought content normal.       Assessment:     3-4 cm soft tissue mass in the left lumbar paraspinous area. 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  Chronic musculoskeletal complaints including pain in the neck and thoracic and lumbar spine. Question whether she may have arthritis or degenerative disc disease.  Tobacco abuse.  Borderline hypertension.    Plan:     Scheduled for MRI of lumbar spine with special attention to the left flank soft tissue mass.  I told her to discuss her chronic spinal complaints with Dr. Drue Novel, and consider referral to orthopedics surgery at some point.  She was advised that the pain in her spine is probably not related to the soft tissue mass.  Return to see me in 2 weeks to discuss the management of the soft tissue mass.

## 2010-10-07 ENCOUNTER — Inpatient Hospital Stay
Admission: RE | Admit: 2010-10-07 | Discharge: 2010-10-07 | Payer: 59 | Source: Ambulatory Visit | Attending: General Surgery | Admitting: General Surgery

## 2010-10-08 ENCOUNTER — Other Ambulatory Visit (INDEPENDENT_AMBULATORY_CARE_PROVIDER_SITE_OTHER): Payer: Self-pay | Admitting: General Surgery

## 2010-10-08 ENCOUNTER — Encounter (INDEPENDENT_AMBULATORY_CARE_PROVIDER_SITE_OTHER): Payer: Self-pay | Admitting: General Surgery

## 2010-10-08 DIAGNOSIS — R1900 Intra-abdominal and pelvic swelling, mass and lump, unspecified site: Secondary | ICD-10-CM

## 2010-10-10 ENCOUNTER — Ambulatory Visit
Admission: RE | Admit: 2010-10-10 | Discharge: 2010-10-10 | Disposition: A | Discharge status: Home / Self Care | Payer: 59 | Source: Ambulatory Visit | Attending: General Surgery | Admitting: General Surgery

## 2010-10-10 DIAGNOSIS — R1900 Intra-abdominal and pelvic swelling, mass and lump, unspecified site: Secondary | ICD-10-CM

## 2010-10-10 MED ORDER — GADOBENATE DIMEGLUMINE 529 MG/ML IV SOLN
13.0000 mL | Freq: Once | INTRAVENOUS | Status: AC | PRN
  Administered 2010-10-10: 13 mL via INTRAVENOUS

## 2010-10-19 ENCOUNTER — Ambulatory Visit (INDEPENDENT_AMBULATORY_CARE_PROVIDER_SITE_OTHER): Payer: 59 | Admitting: General Surgery

## 2010-10-19 ENCOUNTER — Encounter (INDEPENDENT_AMBULATORY_CARE_PROVIDER_SITE_OTHER): Payer: Self-pay | Admitting: General Surgery

## 2010-10-19 VITALS — BP 132/98 | HR 64

## 2010-10-19 DIAGNOSIS — M5136 Other intervertebral disc degeneration, lumbar region: Secondary | ICD-10-CM

## 2010-10-19 DIAGNOSIS — M5137 Other intervertebral disc degeneration, lumbosacral region: Secondary | ICD-10-CM

## 2010-10-19 NOTE — Progress Notes (Signed)
Subjective:     Patient ID: Kelsey [name], female   DOB: 09/02/1950, 60 y.o.   MRN: [id number]  HPI MRI of the lumbar spine with and without contrast shows that the palpable abnormality in the left flank is either normal variation of fatty tissue and muscle tissue or could be one or more  lipomas or fatty atrophy of the oblique muscle. No signs of cancer. It also shows multilevel lumbar disc degeneration with bulges and protrusions. No evidence of significant spinal stenosis. She also has chronic pars fractures with facet degeneration.  Review of Systems 10 system review of systems is negative except as described above.    Objective:   Physical Exam The patient is here with her daughter. She is in no distress.  Left flank mass feels about the same. Several centimeter soft subtle bulge left posterior flank. No skin change. No tenderness.    Assessment:     Soft tissue mass left flank. This could be an anatomic variant, muscle atrophy, or lipoma. I doubt that this is the cause of her pains. I do not advise surgical excision.  There is degenerative lumbar disc disease. No evidence of spinal stenosis.    Plan:     The patient is referred back to Dr. Drue Novel, who will refer her to a spine specialist or neurosurgeon for evaluation of her pain.  Return to see me p.r.n.

## 2010-10-19 NOTE — Patient Instructions (Signed)
The lump in your left back appears to be normal muscle and fat. It does not appear to be any kind of cancer. I advise that we do not operate on this.   The MRI shows degenerative disc disease and arthritis. I advise you to seek referral to a spine specialist through Dr. Porfirio Oar.. Return to see me as needed.

## 2011-04-12 ENCOUNTER — Encounter: Payer: Self-pay | Admitting: *Deleted

## 2011-04-12 NOTE — Progress Notes (Signed)
Patient ID: Kelsey [name], female   DOB: April 15, 1950, 61 y.o.   MRN: [id number] Dr Lily Peer received paperwork from pts employer that she was evaluated for leg pain and swelling due to standing all day. Dr Lily Peer filled out paperwork and signed it and it was faxed back to [facility]. Copy in patients chart.

## 2011-05-13 ENCOUNTER — Encounter: Payer: 59 | Admitting: Gynecology

## 2011-06-11 ENCOUNTER — Ambulatory Visit (INDEPENDENT_AMBULATORY_CARE_PROVIDER_SITE_OTHER): Payer: 59 | Admitting: Gynecology

## 2011-06-11 ENCOUNTER — Encounter: Payer: Self-pay | Admitting: Gynecology

## 2011-06-11 VITALS — BP 130/84 | Ht 60.0 in | Wt 134.0 lb

## 2011-06-11 DIAGNOSIS — M25559 Pain in unspecified hip: Secondary | ICD-10-CM

## 2011-06-11 DIAGNOSIS — F329 Major depressive disorder, single episode, unspecified: Secondary | ICD-10-CM | POA: Insufficient documentation

## 2011-06-11 DIAGNOSIS — M940 Chondrocostal junction syndrome [Tietze]: Secondary | ICD-10-CM

## 2011-06-11 DIAGNOSIS — M8580 Other specified disorders of bone density and structure, unspecified site: Secondary | ICD-10-CM | POA: Insufficient documentation

## 2011-06-11 DIAGNOSIS — Z01419 Encounter for gynecological examination (general) (routine) without abnormal findings: Secondary | ICD-10-CM

## 2011-06-11 DIAGNOSIS — F32A Depression, unspecified: Secondary | ICD-10-CM | POA: Insufficient documentation

## 2011-06-11 DIAGNOSIS — R634 Abnormal weight loss: Secondary | ICD-10-CM

## 2011-06-11 DIAGNOSIS — E559 Vitamin D deficiency, unspecified: Secondary | ICD-10-CM | POA: Insufficient documentation

## 2011-06-11 DIAGNOSIS — M899 Disorder of bone, unspecified: Secondary | ICD-10-CM

## 2011-06-11 DIAGNOSIS — F3289 Other specified depressive episodes: Secondary | ICD-10-CM

## 2011-06-11 LAB — CBC WITH DIFFERENTIAL/PLATELET
Basophils Absolute: 0 10*3/uL (ref 0.0–0.1)
Basophils Relative: 1 % (ref 0–1)
Eosinophils Relative: 2 % (ref 0–5)
HCT: 43.4 % (ref 36.0–46.0)
MCHC: 33.9 g/dL (ref 30.0–36.0)
MCV: 86.1 fL (ref 78.0–100.0)
Monocytes Absolute: 0.3 10*3/uL (ref 0.1–1.0)
Neutro Abs: 3.6 10*3/uL (ref 1.7–7.7)
Platelets: 255 10*3/uL (ref 150–400)
RDW: 13.3 % (ref 11.5–15.5)

## 2011-06-11 LAB — TSH: TSH: 0.383 u[IU]/mL (ref 0.350–4.500)

## 2011-06-11 LAB — GLUCOSE, RANDOM: Glucose, Bld: 76 mg/dL (ref 70–99)

## 2011-06-11 MED ORDER — PAROXETINE HCL 20 MG PO TABS: 10.0000 mg | ORAL_TABLET | ORAL | Status: DC

## 2011-06-11 NOTE — Progress Notes (Signed)
Kelsey [name] 1950/04/30 [id number]   History:    61 y.o.  for annual exam with several complaints. Patient lost her husband last year as well as her sister and has had issues with depression in the past and had been prescribed and SSRI last year but did not take it. She has no suicidal ideation. She has a prior history of TAH BSO and is no longer on hormone replacement therapy and denies any vasomotor symptoms. She has cut down tremendously on her smoking. Review of her record indicated that her last bone density study was in March of 2011 and had significant decrease in bone mineralization at the AP spine and total left hip and total right hip. Her lowest T score was at the right femoral neck and left femoral neck with a value of -1.4. Her Frax analysis was normal otherwise. She was found to have vitamin D deficiency with a value as low as 16 and after 3 months of treatment returned back to normal at 62. The workup for celiac sprue was negative. She also had some joint discomfort and rheumatoid factor was borderline normal and her ANA was negative and her TSH was normal. She was taking vitamin D 3 2000 units daily afterwards but stopped several weeks ago. She is currently taking no medications whatsoever. Her last mammogram was normal in February 2012. She does her monthly self breast examinations. Her last colonoscopy was in 2005 benign polyps were detected.  Past medical history,surgical history, family history and social history were all reviewed and documented in the EPIC chart.  Gynecologic History No LMP recorded. Patient has had a hysterectomy. Contraception: Hysterectomy Last Pap: 2012. Results were: normal Last mammogram: 2012. Results were: normal  Obstetric History OB History    Grav Para Term Preterm Abortions TAB SAB Ect Mult Living   2 2        2      # Outc Date GA Lbr Len/2nd Wgt Sex Del Anes PTL Lv   1 PAR            2 PAR                ROS:  Was performed and pertinent  positives and negatives are included in the history.  Exam: chaperone present  BP 130/84  Ht 5' (1.524 m)  Wt 134 lb (60.782 kg)  BMI 26.17 kg/m2  Body mass index is 26.17 kg/(m^2).  General appearance : Well developed well nourished female. No acute distress HEENT: Neck supple, trachea midline, no carotid bruits, no thyroidmegaly Lungs: Clear to auscultation, no rhonchi or wheezes, or rib retractions  Heart: Regular rate and rhythm, no murmurs or gallops Breast:Examined in sitting and supine position were symmetrical in appearance, no palpable masses or tenderness,  no skin retraction, no nipple inversion, no nipple discharge, no skin discoloration, no axillary or supraclavicular lymphadenopathy Abdomen: no palpable masses or tenderness, no rebound or guarding Extremities: no edema or skin discoloration or tenderness  Pelvic:  Bartholin, Urethra, Skene Glands: Within normal limits             Vagina: No gross lesions or discharge  Cervix: Absent  Uterus absent  Adnexa  Without masses or tenderness  Anus and perineum  normal   Rectovaginal  normal sphincter tone without palpated masses or tenderness             Hemoccult cards presented to the patient to submit to the office for testing. Rectal exam unremarkable  Assessment/Plan:  61 y.o. female for annual exam with clinical evidence as well as by history of depression. We discussed placing her on a SSRI that would help her with not only the depression but with the anxiety. She will be placed on the low dose Paxil 10 mg daily for the next 6-12 months. She was offered psychiatric consultation but declined last year. The risks benefits and pros and cons of Paxil were discussed and literature information was provided as well in [place]. She will be scheduling her bone density study in the next few weeks as well as her mammogram which are overdue. She was encouraged once again to discontinue smoking all together. The following labs  were today: CBC, cholesterol, vitamin D, TSH, random blood sugar, and urinalysis. We discussed the new screening guidelines for Pap smears. She has always had normal Pap smears in the past and has had a hysterectomy and thus will no longer need Pap smears. She was reminded to followup with her gastroenterologist for the appropriate time of her next colonoscopy. She brought to my attention as we were finishing the examination of the tenderness in her left chest wall. On examination it appears to be a mild costochondritis that she can take over-the-counter NSAID when necessary. Her hip discomfort may be attributed to her past history vitamin D deficiency which she began experiencing after she discontinued taking her vitamin D. We'll wait for the results and contact the patient accordingly.    Ok Edwards MD, 4:31 PM 06/11/2011

## 2011-06-11 NOTE — Patient Instructions (Addendum)
Tomar Paxil mitad de tableta diaria en la manana. Tomar vitamina D3 2,000 unidades diara y una de centrum  Depresin (Depression) Usted presenta signos de depresin. Es un trastorno que puede ocurrir a Actuary. Con frecuencia es difcil de Public house manager. Neomia Dear persona puede estar deprimida y adems tener momentos de Archivist. La depresin interfiere en la capacidad bsica para funcionar en la vida diaria. Obstaculiza tanto las [employer] hbitos de sueo, alimentacin y [place]. CAUSAS Se cree que la causa es un desequilibrio en las sustancias qumicas del cerebro. El origen puede ser un hecho displacentero. La crisis en una relacin, la muerte de un familiar, preocupaciones econmicas, la jubilacin y otros factores estresantes son causas normales de depresin. Tambin puede comenzar sin causa aparente. Otros factores que pueden tener incidencia: algunas enfermedades, algunos medicamentos, factores genticos, y consumo excesivo de alcohol o drogas. SNTOMAS  Sentimiento de desdicha o desvalorizacin.   Cansancio crnico o sensacin de agotamiento.   Pensamientos y acciones de autodestruccin.   Dificultad para dormir o dormir demasiado.   Comer ms de lo habitual o no alimentarse en absoluto.   Cefaleas o ansiedad.   Dificultad para concentrase o tomar decisiones.   Sntomas fsicos sin causa y consumo de drogas.  TRATAMIENTO Generalmente mejora si se realiza Pharmacist, community. Entre ellos se incluyen:  Medicamentos antidepresivos. Puede demorara algunas semanas antes de llegar a la dosis [place] y a los beneficios.   Converse con un terapeuta, ministro, consejero o amigo. Estas personas pueden ayudarlo a comprender su problema y a controlar nuevamente sus actos.   Consuma una dieta saludable.   Ralice actividad fisica de [place] regular, como caminar durante 30 minutos [address].   No consuma alcohol ni drogas.  El tratamiento de la depresin puede llevar 6  meses o ms. El tratamiento debe mantenerse para evitar que los sntomas vuelvan a Research officer, trade union. Asegrese de comunicarse con el profesional que lo asiste y Surveyor, mining una entrevista de control, como se lo ha sugerido el equipo que lo ha [place]. SOLICITE ATENCIN MDICA DE INMEDIATO SI:  Comienza a tener pensamientos acerca de lastimarse o daar a Economist.   Comunquese con el servicio de emergencias de su localidad (911 en los [place]).   Concurra al servicio de emergencias mdicas de su localidad.   Comunquese con la Lnea Telefnica Nacional para la Prevencin del Suicidio (National Suicide Prevention Lifeline ) al 1-800-273-TALK [phone]).  Document Released: 02/08/2005 Document Revised: 01/28/2011 [facility] Patient Information 2012 [place], [place].  Costocondritis (Costochondritis) El profesional que lo asiste le ha diagnosticado el sndrome de la unin costocondral. La costocondritis (sndrome de Orthoptist) es la hinchazn e irritacin (inflamacin) del tejido (cartlago) que conecta las costillas con el esternn. Puede ocurrir espontneamente (sin otra causa), por un traumatismo (lesin causada por un accidente) o simplemente al toser, o luego de un ejercicio de poca intensidad. Podr tardar Elaina Hoops 6 semanas en curarse, y ms si no puede restringir sus actividades.  INSTRUCCIONES PARA EL CUIDADO DOMICILIARIO  Evite la actividad fsica extenuante. Trate de no esforzar las [employer] actividades habituales. Aqu se incluyen las [address] en las que [place] los msculos del pecho, abdominales (el vientre) y los Financial planner, especialmente si debe Artist.   Aplique hielo durante 15 a 20 minutos por hora mientras se encuentre despierto, durante los 2 primeros das. Coloque el hielo en una bolsa plstica y ponga una toalla entre la bolsa y la piel.   Utilice los medicamentos de venta [place]  o de prescripcin para el dolor, el malestar o la fiebre, segn se lo  indique el profesional que lo asiste.  SOLICITE ATENCIN MDICA DE INMEDIATO SI:  El dolor aumenta o siente muchas molestias.   Tiene fiebre.   Presenta dificultades respiratorias.   Tose y escupe sangre.   Siente falta de aire o dolor en el pecho, sudoracin o vmitos.   Desarrolla nuevos e inexplicables sntomas (problemas).  EST SEGURO QUE:   Comprende las instrucciones para el alta mdica.   Controlar su enfermedad.   Solicitar atencin mdica de inmediato segn las indicaciones.  Document Released: 11/18/2004 Document Revised: 01/28/2011 [facility] Patient Information 2012 [place], [place].

## 2011-06-12 LAB — URINALYSIS W MICROSCOPIC + REFLEX CULTURE
Bacteria, UA: NONE SEEN
Bilirubin Urine: NEGATIVE
Ketones, ur: NEGATIVE mg/dL
Nitrite: NEGATIVE
Protein, ur: NEGATIVE mg/dL
Urobilinogen, UA: 0.2 mg/dL (ref 0.0–1.0)

## 2011-06-12 LAB — VITAMIN D 25 HYDROXY (VIT D DEFICIENCY, FRACTURES): Vit D, 25-Hydroxy: 57 ng/mL (ref 30–89)

## 2011-06-13 LAB — URINE CULTURE
Colony Count: NO GROWTH
Organism ID, Bacteria: NO GROWTH

## 2011-07-14 ENCOUNTER — Ambulatory Visit (INDEPENDENT_AMBULATORY_CARE_PROVIDER_SITE_OTHER): Payer: 59

## 2011-07-14 DIAGNOSIS — M899 Disorder of bone, unspecified: Secondary | ICD-10-CM

## 2011-07-14 DIAGNOSIS — M8580 Other specified disorders of bone density and structure, unspecified site: Secondary | ICD-10-CM

## 2011-07-18 IMAGING — CR DG CHEST 2V
2 series · 2 of 2 positions shown · non-contrast
Comparison: 12/03/2008

CLINICAL DATA: Sporadic posterior left back pain for 6 months,
hypertension, smoker

CHEST - 2 VIEW

[w chest pa]
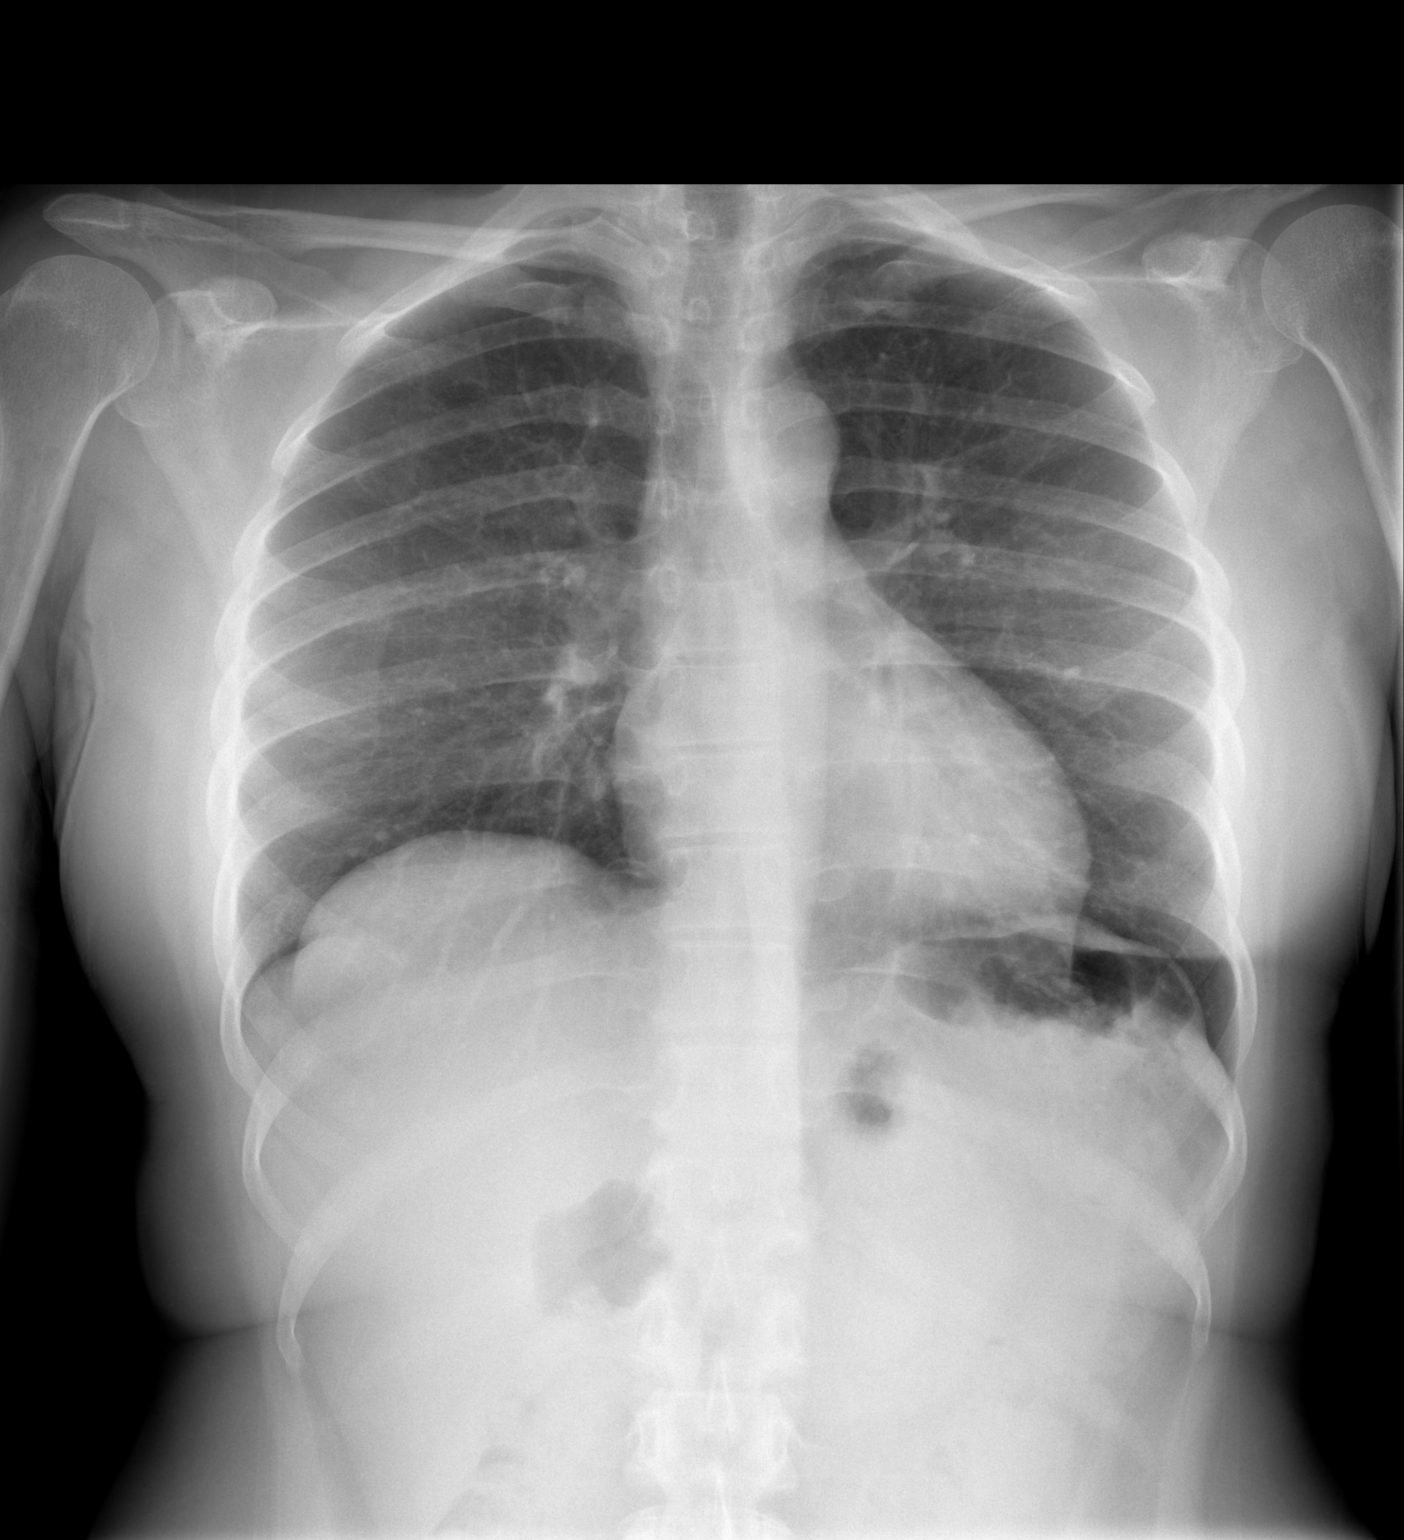

[w chest lat]
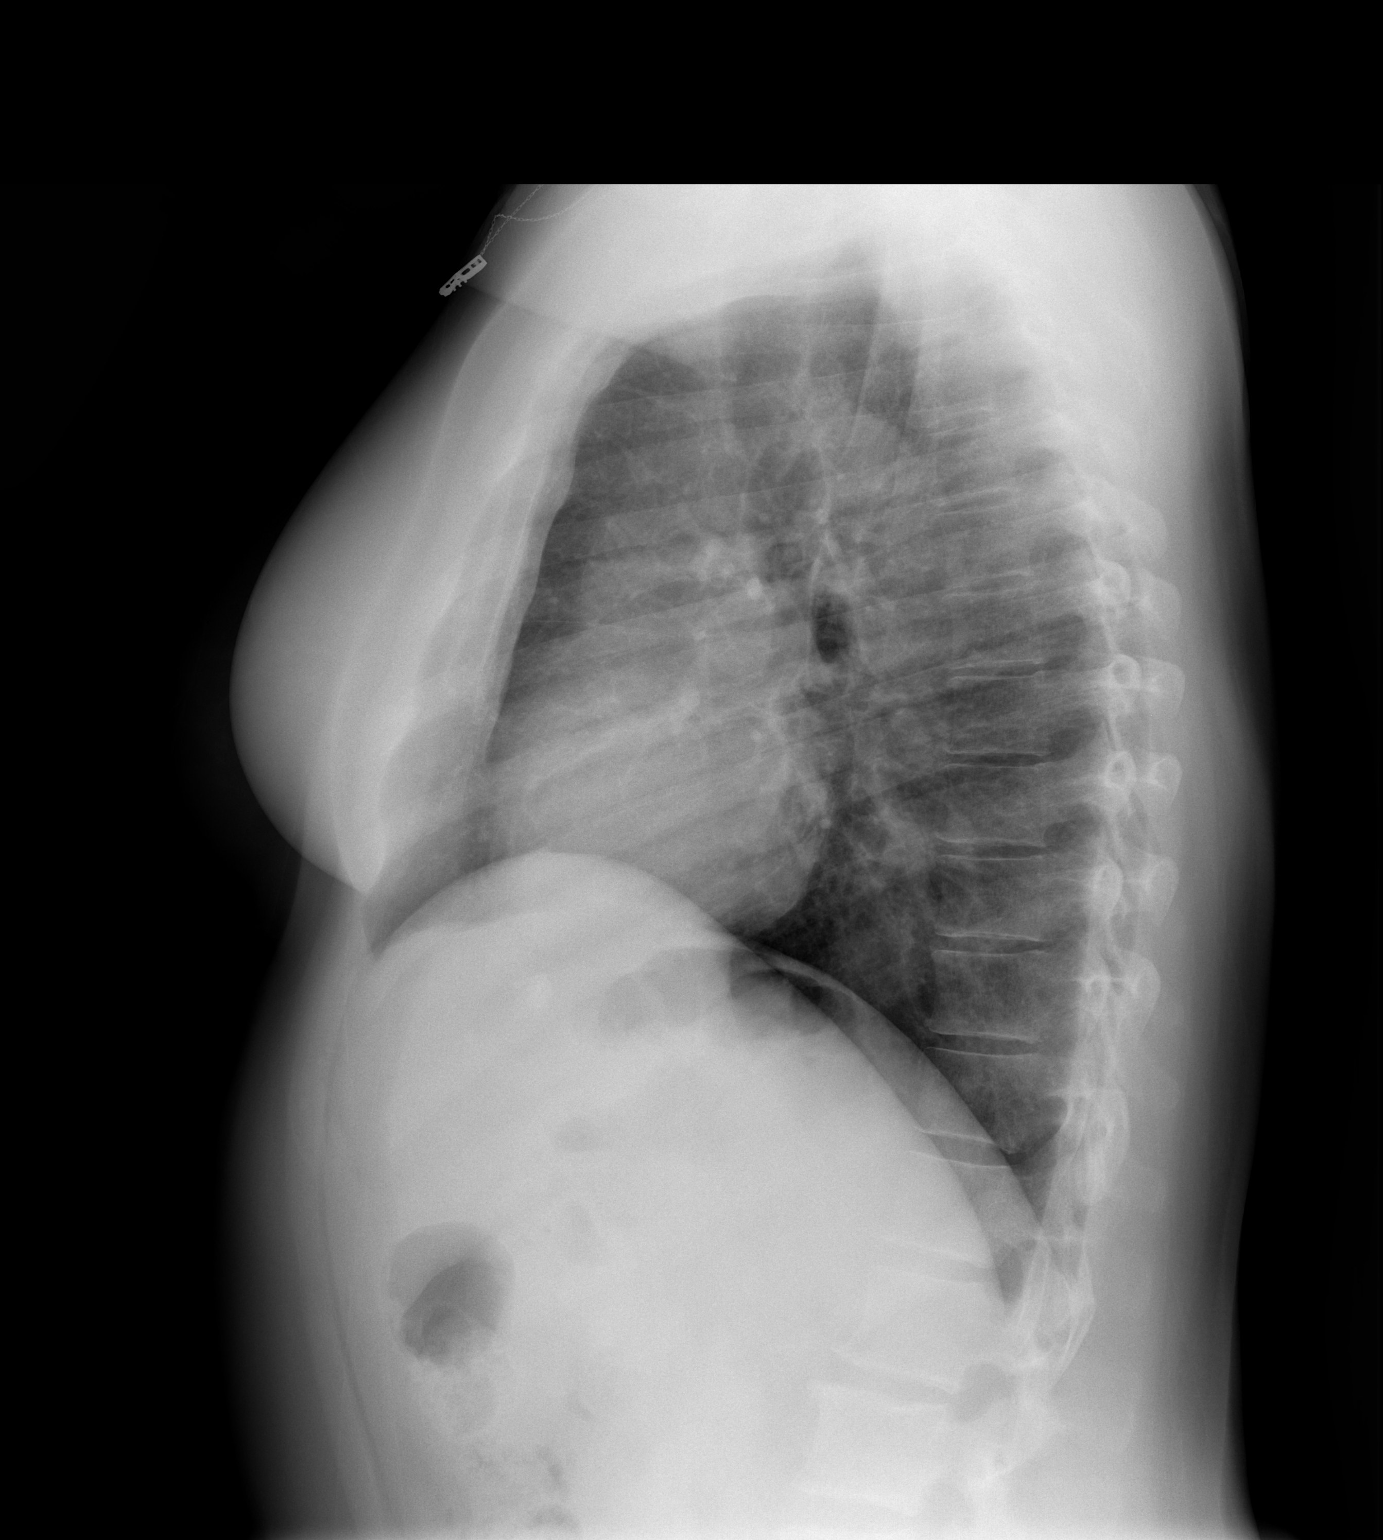

[2 of 2 positions shown; findings below may reference images not displayed]

FINDINGS: Normal heart size, mediastinal contours, and pulmonary vascularity.
Lungs clear.
Question calcified granuloma in left mid lung.
No pleural effusion or pneumothorax.
No acute osseous findings identified.
IMPRESSION: No acute abnormalities.

## 2011-07-22 ENCOUNTER — Encounter: Payer: Self-pay | Admitting: Gynecology

## 2011-07-27 ENCOUNTER — Ambulatory Visit (INDEPENDENT_AMBULATORY_CARE_PROVIDER_SITE_OTHER): Payer: 59 | Admitting: Gynecology

## 2011-07-27 ENCOUNTER — Encounter: Payer: Self-pay | Admitting: Gynecology

## 2011-07-27 ENCOUNTER — Ambulatory Visit (HOSPITAL_COMMUNITY)
Admission: RE | Admit: 2011-07-27 | Discharge: 2011-07-27 | Disposition: A | Discharge status: Home / Self Care | Payer: 59 | Source: Ambulatory Visit | Attending: Gynecology | Admitting: Gynecology

## 2011-07-27 VITALS — BP 128/90

## 2011-07-27 DIAGNOSIS — M25519 Pain in unspecified shoulder: Secondary | ICD-10-CM | POA: Insufficient documentation

## 2011-07-27 DIAGNOSIS — M549 Dorsalgia, unspecified: Secondary | ICD-10-CM

## 2011-07-27 DIAGNOSIS — F32A Depression, unspecified: Secondary | ICD-10-CM

## 2011-07-27 DIAGNOSIS — M8580 Other specified disorders of bone density and structure, unspecified site: Secondary | ICD-10-CM

## 2011-07-27 DIAGNOSIS — F329 Major depressive disorder, single episode, unspecified: Secondary | ICD-10-CM

## 2011-07-27 DIAGNOSIS — M899 Disorder of bone, unspecified: Secondary | ICD-10-CM | POA: Insufficient documentation

## 2011-07-27 DIAGNOSIS — F411 Generalized anxiety disorder: Secondary | ICD-10-CM

## 2011-07-27 DIAGNOSIS — F419 Anxiety disorder, unspecified: Secondary | ICD-10-CM

## 2011-07-27 DIAGNOSIS — M25419 Effusion, unspecified shoulder: Secondary | ICD-10-CM | POA: Insufficient documentation

## 2011-07-27 DIAGNOSIS — M949 Disorder of cartilage, unspecified: Secondary | ICD-10-CM

## 2011-07-27 DIAGNOSIS — F3289 Other specified depressive episodes: Secondary | ICD-10-CM

## 2011-07-27 MED ORDER — CYCLOBENZAPRINE HCL 10 MG PO TABS: ORAL_TABLET | ORAL | Status: DC

## 2011-07-27 MED ORDER — IBUPROFEN 800 MG PO TABS: 800.0000 mg | ORAL_TABLET | Freq: Three times a day (TID) | ORAL | Status: AC | PRN

## 2011-07-27 MED ORDER — PAROXETINE HCL 20 MG PO TABS: 10.0000 mg | ORAL_TABLET | ORAL | Status: DC

## 2011-07-27 NOTE — Patient Instructions (Addendum)
Dolor msculoesqueltico (Musculoskeletal Pain) El dolor musculoesqueltico se siente en huesos y msculos. El dolor puede ocurrir en cualquier parte del cuerpo. El profesional que lo asiste podr tratarlo sin Geologist, engineering causa del dolor. Lo tratar [employer] de laboratorio (sangre y [place]), las radiografas y otros estudios sean normales. La causa de estos dolores puede ser un virus.  CAUSAS Generalmente no existe una causa definida para este trastorno. Tambin el Citigroup puede deberse a la [place]. En la actividad excesiva se incluye el hacer ejercicios fsicos muy intensos cuando no se est en buena forma. El dolor de huesos tambin puede deberse a cambios climticos. Los huesos son sensibles a los cambios en la presin atmosfrica. INSTRUCCIONES PARA EL CUIDADO DOMICILIARIO  Para proteger su privacidad, no se entregarn los [employer] pruebas por telfono. Asegrese de conseguirlos. Consulte el modo en que podr obtenerlos si no se lo han informado. Es su responsabilidad contar con los [employer] Corporation.   Utilice los medicamentos de venta libre o de prescripcin para Chief Technology Officer, Environmental health practitioner o la [place], segn se lo indique el profesional que lo asiste.Si le han administrado medicamentos, no conduzca, no opere maquinarias ni Diplomatic Services operational officer, y tampoco firme documentos legales durante 24 horas. No beba alcohol. No tome pldoras para dormir ni otros medicamentos que Museum/gallery curator.   Podr seguir con todas las actividades a menos que stas le ocasionen ms [employer]ck & [employer]. Cuando el dolor disminuya, es importante que gradualmente reanude toda la rutina habitual. Retome las actividades comenzando lentamente. Aumente gradualmente la intensidad y la duracin de sus actividades o del ejercicio.   Durante los perodos de dolor intenso, el reposo en cama puede ser beneficioso. Recustese o sintese en la posicin que le sea ms cmoda.   Coloque hielo  sobre la zona afectada.   Ponga hielo en Lucile Shutters.   Colquese una toalla entre la piel y la bolsa de hielo.   Aplique el hielo durante 10 a 20 minutos 3  4 veces por da.   Si el dolor empeora, o no desaparece puede ser [employer] Util[employer] repetir las pruebas o Education officer, environmental nuevos exmenes. El profesional que lo asiste podr requerir investigar ms profundamente para Veterinary surgeon causa posible.  SOLICITE ATENCIN MDICA DE INMEDIATO SI:  Siente que el dolor empeora y no se alivia con los medicamentos.   Siente dolor en el pecho asociado a falta de aire, sudoracin, nuseas o vmitos.   El dolor se localiza en el abdomen.   Comienza a sentir nuevos sntomas que parecen ser diferentes o que lo preocupan.  ASEGRESE DE QUE:   Comprende las instrucciones para el alta mdica.   Controlar su enfermedad.   Solicitar atencin mdica de inmediato segn las indicaciones.  Document Released: 11/18/2004 Document Revised: 01/28/2011 [facility] Patient Information 2012 [place], [place].  Depresin  (Depression) La depresin es un sentimiento o sensacin de tristeza que puede durar semanas, meses o an ms tiempo. Interfiere con la capacidad de funcionar en la vida diaria. Perjudica las:   Relaciones.   El sueo.   Hbitos alimentarios y [place] .  CUIDADOS EN EL HOGAR  Tome los Estée [employer] indic el mdico.   Hable con un terapeuta, consejero o con un amigo.   Siga una dieta saludable.   Haga ejercicios con regularidad.   No beba alcohol ni utilice drogas.  SOLICITE AYUDA DE INMEDIATO SI: Comienza a tener pensamientos acerca de lastimarse o daar a Economist. ASEGRESE DE QUE:  Comprende estas instrucciones.   Controlar su enfermedad.   Solicitar ayuda de inmediato si no mejora o empeora.  Document Released: 05/26/2010 Document Revised: 01/28/2011 [facility] Patient Information 2012 [place], [place].

## 2011-07-27 NOTE — Progress Notes (Signed)
Patient presented to the office today to discuss her recent bone density study results as well as complaint of left scapular pain and prominence over the past few days. Patient also was crying in my office today and suffers from depression and anxiety as a result of the death of her husband. I had put her on Paxil when she was here for her annual exam but she has informed me that she never went to pick it up. Have also offered her psychiatric assistance but she declined before. She does live with her daughter and is good support and has no suicidal ideation.  Problem #1: Bone density study results compared with 2011 with significant decrease in the right hip (-4.1%) with a T score of -1.7 Frax analysis indicated that her 10 year probability of major osteoporotic fracture is 4.5% and of hip fracture 0.7% both supple threshold value for additional treatment sites calcium and vitamin D which she's currently on. Patient has had vitamin D deficiency in the past and last month her vitamin D level was normal at 57. She also had a normal calcium, blood sugar, and CBC. She will be reminded to continue to take her calcium vitamin D along with weightbearing exercises. It appears that her bone loss is attributed to her smoking for which she was counseled and information provided. She is scheduled for hypnosis next week to help with her smoking. Will followup with a bone density study in 2 years.  Problem #2 left scapular pain and prominence which she noted the past week. She does a lot of pushing heavy material at work. She denies any shortness of breath or any chest pain. On cardiac auscultation her heart was regular rate and rhythm no murmurs or gallops. Her lungs were clear to auscultation. On exam of her back when she leans forward there slightly more prominence of the left scapular region compared to the right and tender on palpation. Patient will be treated for a musculoskeletal strain with the Flexeril 10 mg each  bedtime for 5 days and Motrin 800 mg 3 times a day for 5 days. She'll be sent to the hospital today to get a chest x-ray PA and lateral with attention to the left scapular region.  Problem #3 depression and anxiety. Patient will be restarted on Paxil 10 mg daily which she states that she will start taking. The risks benefits and pros and cons were discussed. She'll be referred to psychiatrist Dr. Geoffery Lyons who speaks Spanish to continue with her care for depression and anxiety.  All the above was discussed with the patient Spanish and instructions provided and we'll follow accordingly.

## 2011-07-29 ENCOUNTER — Encounter: Payer: Self-pay | Admitting: *Deleted

## 2011-07-29 NOTE — Progress Notes (Signed)
Patient ID: Kelsey [name], female   DOB: [birth year], 61 y.o.   MRN: [id number] Prior authorization approved for Paroxetine 07/08/11-07/28/12.  [facility]  [place] informed.

## 2011-10-04 ENCOUNTER — Encounter: Payer: Self-pay | Admitting: Gynecology

## 2012-06-08 IMAGING — CR DG CHEST 2V
2 series · 2 of 2 positions shown · non-contrast
Comparison: PA and lateral chest 09/04/2010.

CLINICAL DATA: Soft tissue swelling about the left scapula for 3
weeks.

CHEST - 2 VIEW

[view not recorded (1 of 2)]
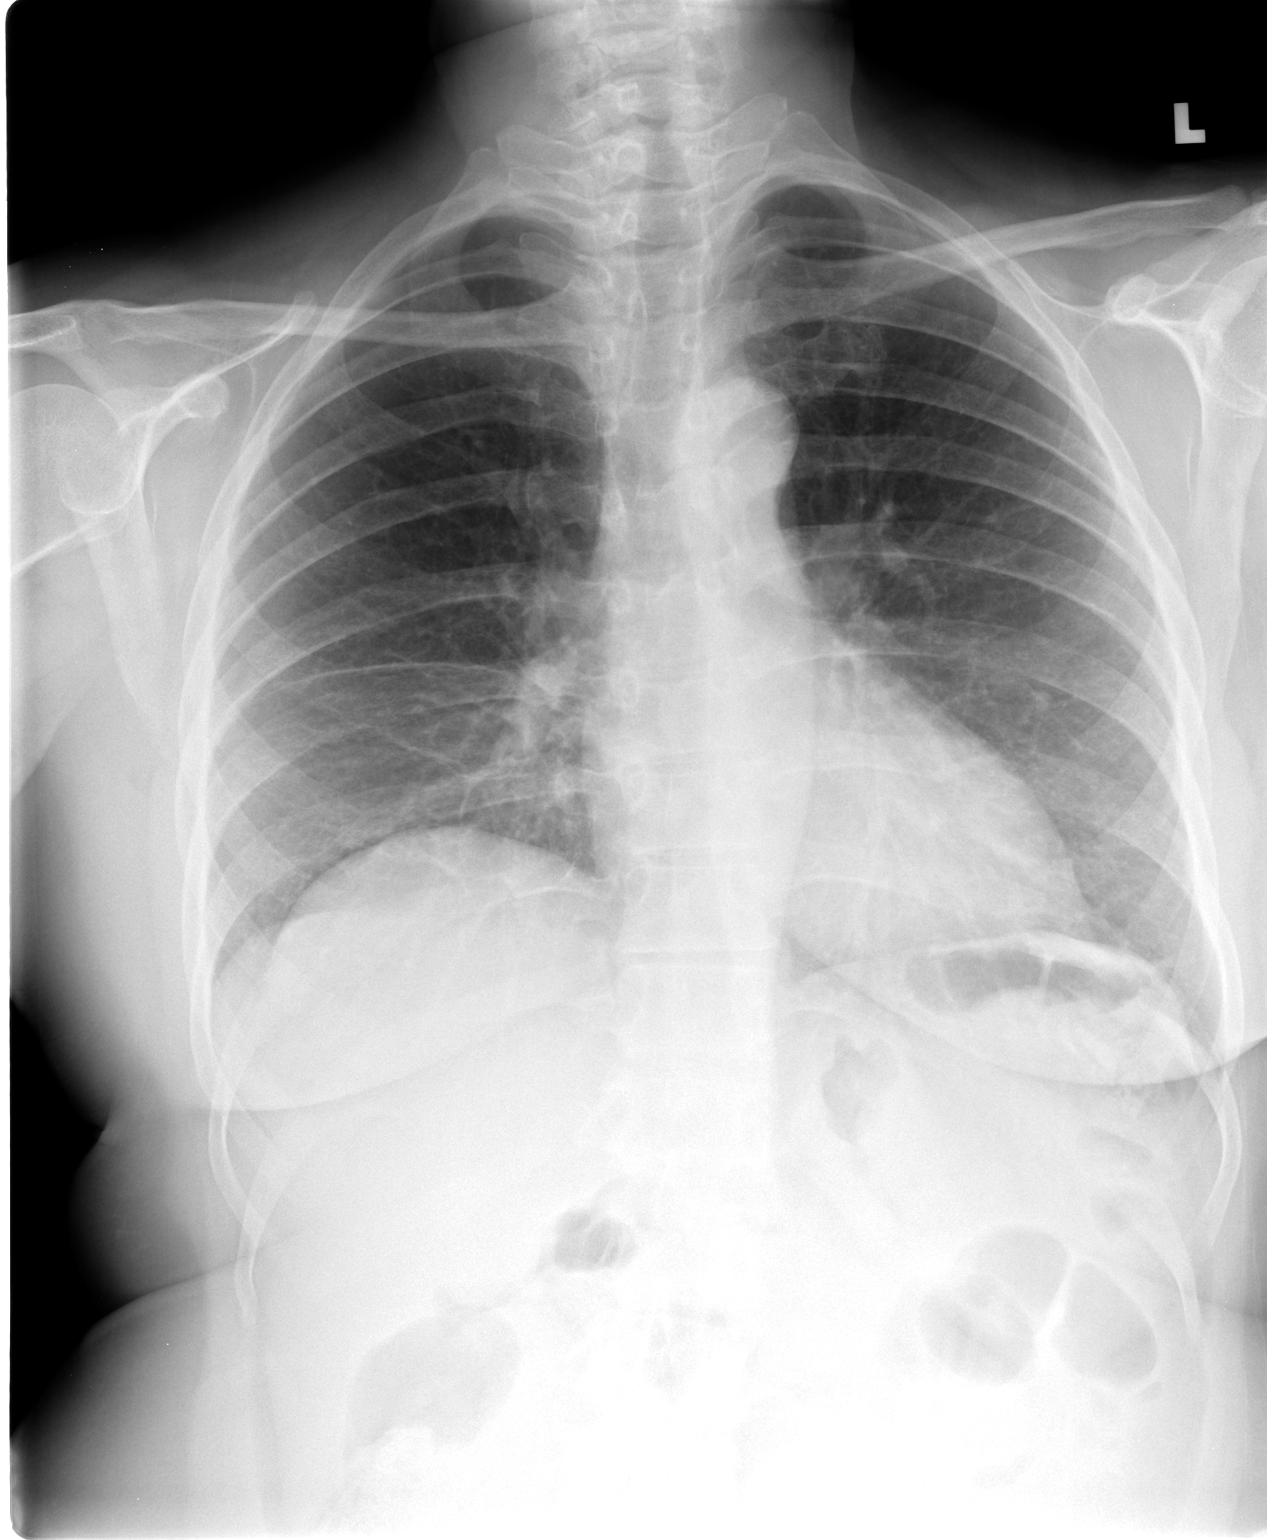

[view not recorded (2 of 2)]
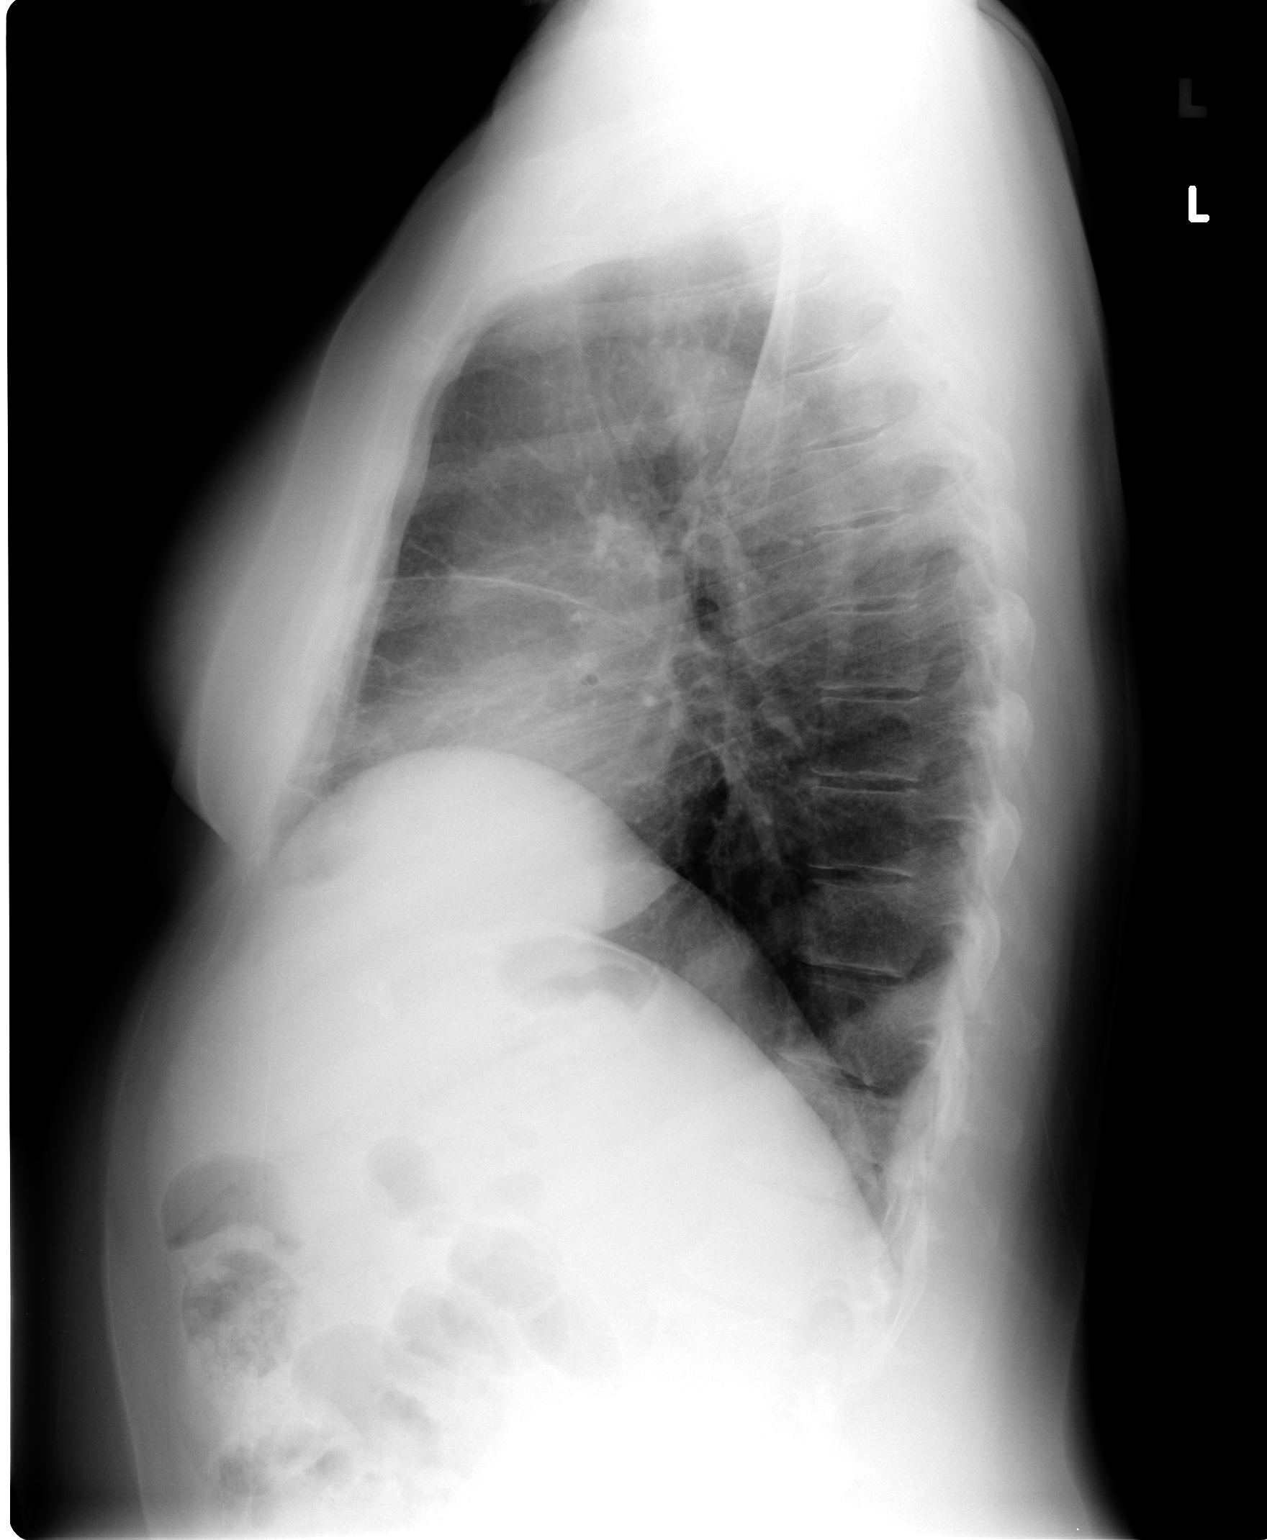

[2 of 2 positions shown; findings below may reference images not displayed]

FINDINGS: Lungs are clear.  Heart size is normal.  No pneumothorax
or pleural fluid.  No soft tissue mass identified by plain film.
No focal bony abnormality.
IMPRESSION: No acute finding.  No mass is seen.

## 2012-06-23 ENCOUNTER — Ambulatory Visit (INDEPENDENT_AMBULATORY_CARE_PROVIDER_SITE_OTHER): Payer: 59 | Admitting: Gynecology

## 2012-06-23 ENCOUNTER — Encounter: Payer: Self-pay | Admitting: Gynecology

## 2012-06-23 VITALS — BP 126/88 | Ht 60.0 in | Wt 132.0 lb

## 2012-06-23 DIAGNOSIS — F32A Depression, unspecified: Secondary | ICD-10-CM

## 2012-06-23 DIAGNOSIS — Z01419 Encounter for gynecological examination (general) (routine) without abnormal findings: Secondary | ICD-10-CM

## 2012-06-23 DIAGNOSIS — F419 Anxiety disorder, unspecified: Secondary | ICD-10-CM | POA: Insufficient documentation

## 2012-06-23 DIAGNOSIS — M8580 Other specified disorders of bone density and structure, unspecified site: Secondary | ICD-10-CM

## 2012-06-23 DIAGNOSIS — F17200 Nicotine dependence, unspecified, uncomplicated: Secondary | ICD-10-CM | POA: Insufficient documentation

## 2012-06-23 DIAGNOSIS — F329 Major depressive disorder, single episode, unspecified: Secondary | ICD-10-CM

## 2012-06-23 DIAGNOSIS — Z23 Encounter for immunization: Secondary | ICD-10-CM

## 2012-06-23 DIAGNOSIS — M899 Disorder of bone, unspecified: Secondary | ICD-10-CM

## 2012-06-23 DIAGNOSIS — F3289 Other specified depressive episodes: Secondary | ICD-10-CM

## 2012-06-23 DIAGNOSIS — F411 Generalized anxiety disorder: Secondary | ICD-10-CM

## 2012-06-23 DIAGNOSIS — L538 Other specified erythematous conditions: Secondary | ICD-10-CM

## 2012-06-23 DIAGNOSIS — M949 Disorder of cartilage, unspecified: Secondary | ICD-10-CM

## 2012-06-23 DIAGNOSIS — L304 Erythema intertrigo: Secondary | ICD-10-CM

## 2012-06-23 LAB — CBC WITH DIFFERENTIAL/PLATELET
Basophils Absolute: 0 10*3/uL (ref 0.0–0.1)
Eosinophils Relative: 1 % (ref 0–5)
Lymphocytes Relative: 19 % (ref 12–46)
MCV: 83.8 fL (ref 78.0–100.0)
Neutro Abs: 5.5 10*3/uL (ref 1.7–7.7)
Neutrophils Relative %: 75 % (ref 43–77)
Platelets: 248 10*3/uL (ref 150–400)
RDW: 14.3 % (ref 11.5–15.5)
WBC: 7.4 10*3/uL (ref 4.0–10.5)

## 2012-06-23 MED ORDER — NYSTATIN-TRIAMCINOLONE 100000-0.1 UNIT/GM-% EX CREA: TOPICAL_CREAM | Freq: Three times a day (TID) | CUTANEOUS | Status: DC

## 2012-06-23 MED ORDER — PAROXETINE HCL 20 MG PO TABS: 10.0000 mg | ORAL_TABLET | ORAL | Status: AC

## 2012-06-23 NOTE — Progress Notes (Signed)
Kelsey [name] 1950-10-15 [id number]   History:    62 y.o.  for annual gyn exam with no complaints today. Patient's primary physician had been Dr. Drue Novel but she has not seen them in quite some time. Patient does have past history of osteopenia, vitamin D deficiency, and has suffer from depression and anxiety but has never taken to Paxil that was previously prescribed. She does smoke when she is stressed. Patient with past history of total abdominal hysterectomy with bilateral salpingo-oophorectomy. Last bone density study was in 2013 her lowest T score was at the right femoral neck with a value of -1.7 in the osteopenic range but with normal Frax indices. Her last colonoscopy was in 2005 benign polyps were reported. She has not followed up. Her last mammogram was normal August 2013. She is taking her calcium and vitamin D. Many years ago greater than 20 years ago she stated that she had some form of dysplasia and had cryotherapy of her cervix but subsequent Pap smears have been normal. Patient has not received a Tdap vaccine or the shingles vaccine.  Past medical history,surgical history, family history and social history were all reviewed and documented in the EPIC chart.  Gynecologic History No LMP recorded. Patient has had a hysterectomy. Contraception: status post hysterectomy Last Pap: 2012. Results were: normal Last mammogram:  2013. Results were: normal  Obstetric History OB History   Grav Para Term Preterm Abortions TAB SAB Ect Mult Living   2 2        2      # Outc Date GA Lbr Len/2nd Wgt Sex Del Anes PTL Lv   1 PAR            2 PAR                ROS: A ROS was performed and pertinent positives and negatives are included in the history.  GENERAL: No fevers or chills. HEENT: No change in vision, no earache, sore throat or sinus congestion. NECK: No pain or stiffness. CARDIOVASCULAR: No chest pain or pressure. No palpitations. PULMONARY: No shortness of breath, cough or wheeze.  GASTROINTESTINAL: No abdominal pain, nausea, vomiting or diarrhea, melena or bright red blood per rectum. GENITOURINARY: No urinary frequency, urgency, hesitancy or dysuria. MUSCULOSKELETAL: No joint or muscle pain, no back pain, no recent trauma. DERMATOLOGIC: No rash, no itching, no lesions. ENDOCRINE: No polyuria, polydipsia, no heat or cold intolerance. No recent change in weight. HEMATOLOGICAL: No anemia or easy bruising or bleeding. NEUROLOGIC: No headache, seizures, numbness, tingling or weakness. PSYCHIATRIC: No depression, no loss of interest in normal activity or change in sleep pattern.     Exam: chaperone present  BP 126/88  Ht 5' (1.524 m)  Wt 132 lb (59.875 kg)  BMI 25.78 kg/m2  Body mass index is 25.78 kg/(m^2).  General appearance : Well developed well nourished female. No acute distress HEENT: Neck supple, trachea midline, no carotid bruits, no thyroidmegaly Lungs: Clear to auscultation, no rhonchi or wheezes, or rib retractions  Heart: Regular rate and rhythm, no murmurs or gallops Breast:Examined in sitting and supine position were symmetrical in appearance, no palpable masses or tenderness,  no skin retraction, no nipple inversion, no nipple discharge, no skin discoloration, no axillary or supraclavicular lymphadenopathy Abdomen: no palpable masses or tenderness, no rebound or guarding Extremities: no edema or skin discoloration or tenderness  Pelvic:  Bartholin, Urethra, Skene Glands: Within normal limits  Vagina: No gross lesions or discharge  Cervix: absent  Uterus Absent  Adnexa  Without masses or tenderness  Anus and perineum  normal   Rectovaginal  normal sphincter tone without palpated masses or tenderness             Hemoccult cards provided     Assessment/Plan:  62 y.o. female for annual exam with no symptoms reported but on further questioning she still suffering from depression and anxiety she lost her husband several years ago. She was  afraid to begin the Paxil. We are going to restart her again on a low 20 mg dose early in the evening once a day. The following labs will be ordered today: CBC, screening cholesterol, comprehensive metabolic panel, urinalysis, vitamin D level. No Pap smear done today the new guidelines discussed. She received a Tdap vaccine today. Requisition to obtain her shingles vaccine in her local pharmacy was provided as well. We discussed importance of calcium and vitamin D and regular exercise for osteoporosis prevention. She was reminded that she needs to schedule her colonoscopy. Also she needs to followup with her internist.    Ok Edwards MD, 6:10 PM 06/23/2012

## 2012-06-23 NOTE — Patient Instructions (Addendum)
Vacuna difteria/ttanos (Td) o [place] difteria, ttanos, tos convulsa (Tdap), Lo que debe saber (Tetanus, Diphtheria [Td] or Tetanus, Diphtheria, Pertussis [Tdap] Vaccine, What You Need to Know) PORQU VACUNARSE? El ttanos , la difteria y la tos ferina pueden ser enfermedades graves.  El TTANOS  (trismo) provoca la contraccin dolorosa y rigidez de los msculos, por lo general, en todo el cuerpo.   Puede causar la contraccin de los msculos de la cabeza y el cuello de modo que el enfermo no puede abrir la boca ni tragar., y en algunos casos, tampoco puede respirar.. El ttanos causa la muerte de 1 de cada 5 personas que se infectan. LA DIFTERIA produce la formacin de una membrana gruesa que cubre el fondo de la garganta.  Puede causar problemas respiratorios, parlisis, insuficiencia cardaca, e incluso la muerte. El PERTUSIS (tos [place]) causa ataques de tos intensa que pueden dificultar la respiracin, provocar vmitos e interrumpir el sueo.   Puede causar prdida de peso, incontinencia, fractura de [place], y desmayos por la intensa tos. Hasta de 2 de cada 100 adolescentes y 5 de cada 100 adultos que enferman de tos [place] deben ser hospitalizados o tienen complicaciones como la neumona y la [place]. Estas 3 enfermedades son provocadas por bacterias. La difteria y la tos Benetta Spar se [place] de persona a Social worker. El ttanos ingresa al organismo a travs de cortes, rasguos o heridas. En los [place] ocurran alrededor de 200 000 casos por ao de difteria y tos [place], antes de que existieran las [place], y tambin ocurran cientos de casos de ttanos. Desde la aparicin de las vacunas, el ttanos y la difteria han disminuido en alrededor del 99% y los casos de tos ferina disminuyeron aproximadamente el 92%.  Los nios menores de 6 aos deben recibir la vacuna DTaP para estar protegidos contra estas tres enfermedades. Pero los [employer], los adolescentes y los adultos tambin  necesitan proteccin. VACUNAS PARA ADOLESCENTES Y ADULTOS Vacunas Tdap y Td  Hay dos vacunas disponibles para proteger de estas enfermedades a nios a Glass blower/designer de los 7aos:   La vacuna Td fue utilizada durante muchos aos. Protege contra el ttanos y la difteria.  La vacuna Tdap fue autorizada en 2005. Es la primera vacuna para adolescentes y adultos que protege contra la tos ferina y el ttanos y la difteria. Una dosis de refuerzo de la Td se recomienda cada 10 aos. La Tdap se aplica slo una vez.  QU VACUNA DEBO APLICARME Y CUANDO? Las edades de 7 a 18 aos  Dynegy 11 y los 12 aos se recomienda una dosis de Tdap. Esta dosis puede aplicarse desde los 7 aos en los nios que no han recibido una o ms dosis de DTaP anteriormente.  Los nios y adolescentes que no recibieron todas las dosis programadas de DTaP o DTP a los 7 aos deben completar la serie usando una combinacin de Td y Tdap. Adultos de 19 aos o ms  [employer] adultos deben recibir una dosis de refuerzo de Td cada 10 aos. Los adultos de menos de 65 aos que nunca hayan recibido la Tdap deben reemplazarla por la siguiente dosis de refuerzo. Los adultos a partir de los 65 aos puedenrecibir una dosis de Tdap.  Los adultos (incluyendo las mujeres que podran quedar embarazadas y los adultos mayores de 65 aos) que tienen contacto cercano con un beb menor de 12 meses deben aplicarse una dosis de Tdap para proteger al beb de la tos [place].  Los trabajadores de  la salud que tengan contacto directo con pacientes en hospitales o clnicas deben recibir una dosis de Tdap. Proteccin despus de [place] herida  Es posible que una persona que tenga un corte o quemadura grave necesite una dosis de Td o Tdap para prevenir la infeccin por ttanos. Puede usarse la Tdap en personas que nunca recibieron una dosis. Pero debe usarse la Td, si la Tdap no se encuentra disponible, o para:  Cualquier persona que haya recibido una dosis de  Tdap.  Los nios [employer] 7 y los 9 aos que han [employer] series de DTap anteriormente.  Adultos de 65 aos o ms. Mujeres embarazadas.   Las mujeres embarazadas que nunca recibieron una dosis de Ddap deben recibirla despus de la 20a semana de gestacin y preferiblemente durante Contractor. trimestre. Si no se aplican la Tdap durante el embarazo, deben recibirla lo antes posible despus del parto. Las mujeres embarazadas que han recibido la Tdap y tienen que aplicarse la vacuna contra el ttanos o la difteria durante el [place], deben recibir la Td. Las vacunas Tdap y Td pueden ser administradas al mismo tiempo que otras vacunas. ALGUNAS PERSONAS NO DEBEN RECIBIR LA VACUNA O DEBEN [employer]  Las personas que hayan tenido una reaccin alrgica que haya puesto en peligro su vida despus de una dosis de vacuna contra el ttanos, la difteria o la tos ferina no deben recibir Td ni Tdap..  Las personas que tengan alergias graves a algn componente de una vacuna no deben recibir esa vacuna. Informe a su mdico si la persona que recibe la vacuna sufre alergias graves.  Cualquier persona que [employer] Companies en coma o que haya tenido convulsiones dentro de los 7 [address]  [address] posteriores despus de una dosis de DTP o DTaP no debe recibir la Tdap, salvo que se encuentre una causa que no fuera la vacuna. Estas personas pueden recibir Td.  Consulte a su mdico si la persona que recibe [place] de las vacunas:  Tiene epilepsia o algn otro problema del sistema nervioso.  Tuvo inflamacin o dolor intenso despus de una dosis de DTP, DTaP, DT, Td, o Tdap.  Ha tenido el sndrome de Scientific laboratory technician (GBS por sus siglas en ingls). Las personas que sufran una enfermedad moderada o grave el da en que se programa la vacuna, deben esperar a recuperarse para recibir las vacunas Tdap o Td. Por lo general, una persona con una enfermedad leve o fiebre baja puede recibir la vacuna. CULES SON LOS RIESGOS DE LAS VACUNAS TDAP Y  TD? Con Cathleen Corti, al igual que con cualquier [employer] Data, siempre existe un pequeo riesgo de una reaccin alrgica que ponga en peligro la vida o cause otro problema grave. Todo procedimiento mdico, inclusive la vacunacin pueden causar breves episodios de lipotimia o sntomas relacionados (como movimientos espasmdicos). Para evitar los [employer] y las lesiones causadas por las cadas, permanezca sentado o recustese durante los 15 minutos posteriores a la vacunacin. Informe a su mdico si el paciente se siente dbil o mareado, tiene cambios en la visin o siente zumbidos en los odos.  Es mucho ms probable que tener ttanos, difteria, o tos ferina cause problemas ms graves que los provocados por recibir cualquiera de las vacunas Td o Tdap. A continuacin se enumeran los problemas informados despus de las vacunas Td y Tdap. Problemas Leves (perceptibles, pero que no interfirieron con las actividades): Tdap  Dolor (alrededor de 3 de cada 4 adolescentes y 2 de cada 3 adultos).  Enrojecimiento o inflamacin en  el sitio de la inyeccin (alrededor de 1 de cada 5).  Fiebre leve de al menos 100.4 F (38 C) (hasta alrededor de 1 cada 25 adolescentes y 1 de cada 100 adultos).  Dolor de cabeza (alrededor de 4 de cada 10 adolescentes y 3 de cada 10 adultos).  Cansancio (alrededor de 1 de cada 3 adolescentes y 1 de cada 4 adultos).  Nuseas, vmitos, diarrea, o dolor de estmago (hasta 1 de cada 4 adolescentes y 1 de cada 10 adultos).  Escalofros, dolores corporales, dolor articular, erupciones, o inflamacin de las glndulas (poco frecuente). Td  Dolor (hasta alrededor de 8 de cada 10).  Enrojecimiento o inflamacin de la inyeccin (alrededor de 1 de cada 3).  Fiebre leve (hasta alrededor de 1 de cada 5).  Dolor de cabeza o cansancio (poco frecuente). Problemas Moderados (interfieren con las [place], [place] no requieren atencin mdica): Tdap  Dolor en el sitio de la inyeccin  (alrededor de 1 de cada 20 adolescentes y 1 de cada 100 adultos).  Enrojecimiento o inflamacin de la inyeccin (alrededor de 1 de cada 16 adolescentes y 1 de cada 25 adultos).  Fiebre de ms de 102 F (38.9 C) (alrededor de 1 de cada 100 adolescentes y 1 de cada 250 adultos).  Dolor de cabeza (1 de cada 300).  Nuseas, vmitos, diarrea, o dolor de estmago (hasta 3 de cada 100 adolescentes y 1 de cada 100 adultos). Td  Fiebre de ms de 102 F (38.9 C) (poco comn). Tdap o Td  Inflamacin de gran extensin en el brazo en el que se aplic la vacuna (hasta 3 de cada 100). Problemas [place] (no puede realizar [employer]; requiere Psychologist, prison and probation services) Tdap o Td  Inflamacin, dolor intenso, sangrado y enrojecimiento en el brazo, en el sitio de la inyeccin (poco frecuente). Puede producirse una reaccin alrgica grave despus de cualquier vacuna. Se estima que estas reacciones ocurren en menos de una de cada un milln de dosis. QU PASA SI HAY UNA REACCIN GRAVE? Qu signos debo buscar? Cualquier estado poco habitual, como una reaccin alrgica grave o fiebre alta. Si le produce Runner, broadcasting/film/video grave, se manifestar dentro de algunos minutos a una hora despus de recibir la vacuna. Entre los signos de Automotive engineer grave se encuentran la dificultad para respirar, debilidad, ronquera o sibilancias, latidos cardacos acelerados, urticaria, mareos, palidez, o inflamacin de la garganta. Qu debo hacer?  Comunquese con su mdico o lleve inmediatamente a la persona al mdico.  Dgale a su mdico qu ocurri, la fecha y hora en que sucedi y cundo le aplicaron la vacuna.  Pida a su mdico que informe sobre la reaccin llenando un formulario del Sistema de Informacin de Reacciones Adversos a las Administrator, arts (VAERS, por sus siglas en ingls). O, puede presentar este informe a travs del sitio web de VAERS [email or web address] o puede llamar al [phone]. VAERS no brinda  asistencia mdica. PROGRAMA NACIONAL DE COMPENSACIN DE DAOS POR VACUNAS El [place] de Compensacin de Daos por Vacunas (VICP) fue creado en 1986.  Aquellas personas que consideren que han sufrido un dao como consecuencia de una vacuna y quieren saber ms acerca del programa y como presentar Roslynn Amble, [place] llamar al [phone] o visitar su sitio web en [email or web address]  CMO Roxan Diesel MS INFORMACIN?  El profesional podr darle el prospecto de la vacuna o sugerirle otras fuentes de informacin.  Comunquese con el servicio de salud de su localidad o [address].  Comunquese con los Centros para el  control y la prevencin de Child psychotherapist for Disease Control and Prevention , CDC).  Llame al [phone] (1-800-CDC-INFO).  Visite los sitios web de Energy Transfer Partners en PicCapture.uy CDC Td and Tdap Interim VIS-Spanish (03/17/10) Document Released: 05/27/2008 Document Revised: 05/03/2011 [facility] Patient Information 2013 [place], [place].  Culebrilla (Shingles) La causa de la culebrilla es el mismo virus que provoca la varicela (el virus varicella zoster o VZV). Generalmente aparece varios aos o dcadas despus de sufrir varicela. Por eso es ms frecuente entre [employer]OK de 50 aos. El virus se reactiva e irrumpe como una infeccin en una raz nerviosa.  SNTOMAS  La sensacin inicial puede ser de dolor. Este dolor generalmente se describe como:  Ardor.  Lacerante.  Punzante.  Hormigueo en la raz nerviosa.  Luego de un par Teachers Insurance and Annuity Association erupcin. La erupcin puede aparecer en cualquier zona del cuerpo y habitualmente de un solo lado (unilateral) siguiendo un patrn de banda o cinturn. Generalmente comienza como ampollas (vesculas) muy pequeas. Estas se secarn luego de 7 a 10 das. En general, no es un problema significativo, excepto por el dolor que causa.  Es ms frecuente que el dolor de larga duracin  (crnico) se presente en personas de edad avanzada. Puede persistir desde [employer]. Este trastorno se denomina neuralgia postherptica. La culebrilla puede transformarse en una infeccin muy grave en algunas personas que sufren de [place], que tienen un sistema inmunolgico debilitado o en algunas formas de leucemia. Tambin puede llegar a ser grave si est tomando medicamentos por haber recibido un transplante u otros medicamentos que debilitan el sistema inmunolgico. TRATAMIENTO El profesional que lo asiste lo tratar con:  Medicacin antiviral.  Medicamentos antiinflamatorios.  Medicamentos para Primary school teacher. Es muy importante hacer reposo en cama para evitar el dolor asociado al herpes zoster (neuralgia postherptica). Se recomienda la aplicacin de calor con una botella llena de agua caliente o una almohadilla elctrica, o aplicando presin con la mano, para Engineer, materials o las [place]. PREVENCIN Hay disponible una vacuna que ayuda a la proteccin contra la varicela zoster. La Food and Drug Administration aprob la vacuna para los individuos de 50 o ms aos de [place]. INSTRUCCIONES PARA EL CUIDADO DOMICILIARIO  Las compresas fras en la zona de la erupcin pueden ser de [place].  Solo tome medicamentos que se pueden comprar sin receta o recetados para Chief Technology Officer, Dentist o fiebre, como le indica el mdico.  Evite el contacto con:  Bebs.  Mujeres embarazadas.  Nios con eczema.  Personas mayores que han recibido un transplante.  Personas con enfermedades crnicas, como leucemia y [place].  Si la zona involucrada est en el rostro, ser derivado a un especialista para Presenter, broadcasting. Es muy importante que cumpla con las visitas para Animator. Esto le ayudar a Librarian, academic en los ojos y dolor crnico o discapacidad. SOLICITE ATENCIN MDICA DE INMEDIATO SI:  Presenta algn dolor (cefalea) en la zona del rostro o los ojos. Deber controlar  este problema con el profesional que lo asiste o con un oftalmlogo. Una infeccin en una parte del ojo (crnea) puede ser muy grave. Podra conducir a la ceguera.  No obtiene alivio de los [employer].  El enrojecimiento o inflamacin se extiende.  La zona tratada se hincha o duele.  Tiene fiebre.  Nota alguna lnea roja o dolorosa que se extiende hacia fuera de la zona afectada en direccin al corazn (limpangitis).  El trastorno [place] o es diferente al que  lo trajo a la consulta. Document Released: 11/18/2004 Document Revised: 05/03/2011 [facility] Patient Information 2013 [place], [place].

## 2012-06-24 LAB — URINALYSIS W MICROSCOPIC + REFLEX CULTURE
Bilirubin Urine: NEGATIVE
Crystals: NONE SEEN
Glucose, UA: NEGATIVE mg/dL
Protein, ur: NEGATIVE mg/dL
Specific Gravity, Urine: 1.011 (ref 1.005–1.030)
Squamous Epithelial / LPF: NONE SEEN

## 2012-06-24 LAB — COMPREHENSIVE METABOLIC PANEL
ALT: 20 U/L (ref 0–35)
AST: 22 U/L (ref 0–37)
Calcium: 10.1 mg/dL (ref 8.4–10.5)
Chloride: 106 mEq/L (ref 96–112)
Creat: 0.68 mg/dL (ref 0.50–1.10)
Sodium: 140 mEq/L (ref 135–145)

## 2013-01-08 ENCOUNTER — Encounter: Payer: Self-pay | Admitting: Gynecology

## 2013-01-25 ENCOUNTER — Encounter: Payer: Self-pay | Admitting: Gynecology

## 2013-07-26 ENCOUNTER — Encounter: Payer: Self-pay | Admitting: Gynecology

## 2013-08-13 ENCOUNTER — Ambulatory Visit (INDEPENDENT_AMBULATORY_CARE_PROVIDER_SITE_OTHER): Payer: 59 | Admitting: Gynecology

## 2013-08-13 ENCOUNTER — Encounter: Payer: Self-pay | Admitting: Gynecology

## 2013-08-13 ENCOUNTER — Other Ambulatory Visit (HOSPITAL_COMMUNITY)
Admission: RE | Admit: 2013-08-13 | Discharge: 2013-08-13 | Disposition: A | Discharge status: Home / Self Care | Payer: 59 | Source: Ambulatory Visit | Attending: Gynecology | Admitting: Gynecology

## 2013-08-13 VITALS — BP 128/88 | Ht 59.5 in | Wt 140.0 lb

## 2013-08-13 DIAGNOSIS — M8580 Other specified disorders of bone density and structure, unspecified site: Secondary | ICD-10-CM

## 2013-08-13 DIAGNOSIS — L538 Other specified erythematous conditions: Secondary | ICD-10-CM

## 2013-08-13 DIAGNOSIS — Z01419 Encounter for gynecological examination (general) (routine) without abnormal findings: Secondary | ICD-10-CM | POA: Insufficient documentation

## 2013-08-13 DIAGNOSIS — M899 Disorder of bone, unspecified: Secondary | ICD-10-CM

## 2013-08-13 DIAGNOSIS — R635 Abnormal weight gain: Secondary | ICD-10-CM

## 2013-08-13 DIAGNOSIS — Z8639 Personal history of other endocrine, nutritional and metabolic disease: Secondary | ICD-10-CM

## 2013-08-13 DIAGNOSIS — L304 Erythema intertrigo: Secondary | ICD-10-CM

## 2013-08-13 DIAGNOSIS — N951 Menopausal and female climacteric states: Secondary | ICD-10-CM

## 2013-08-13 DIAGNOSIS — Z1151 Encounter for screening for human papillomavirus (HPV): Secondary | ICD-10-CM | POA: Insufficient documentation

## 2013-08-13 DIAGNOSIS — M949 Disorder of cartilage, unspecified: Secondary | ICD-10-CM

## 2013-08-13 MED ORDER — NYSTATIN-TRIAMCINOLONE 100000-0.1 UNIT/GM-% EX CREA: 1.0000 "application " | TOPICAL_CREAM | Freq: Three times a day (TID) | CUTANEOUS | Status: AC

## 2013-08-13 NOTE — Patient Instructions (Signed)
Calcium 1200 mg al dia Vitamina D3 2,000 unidiades al dia

## 2013-08-13 NOTE — Progress Notes (Signed)
Kelsey [name] February 02, 1951 [id number]   History:    63 y.o.  for annual gyn exam days at a time she complains of hot flashes. She was on HRT in the past and no longer on it. She is in the process of changing her PCP which will now be Dr.Cabeza she will be scheduled an appointment to see. Patient has past history of osteopenia and vitamin D deficiency. Patient has suffered in the past from depression and anxiety but has never taken medication that was prescribed to her. Patient stated that she stopped smoking 7 months ago.Patient with past history of total abdominal hysterectomy with bilateral salpingo-oophorectomy. Last bone density study was in 2013 her lowest T score was at the right femoral neck with a value of -1.7 in the osteopenic range but with normal Frax indices. Her last colonoscopy was in 2005 benign polyps and is in the process of scheduling her colonoscopy. Patient does have her Tdap but has not had her shingles vaccine yet.   Past medical history,surgical history, family history and social history were all reviewed and documented in the EPIC chart.  Gynecologic History No LMP recorded. Patient has had a hysterectomy. Contraception: status post hysterectomy Last Pap: 2012. Results were: normal Last mammogram: 2015. Results were: Benign calcifications of the right breast were reported with a followup recommendations 6 months.  Obstetric History OB History  Gravida Para Term Preterm AB SAB TAB Ectopic Multiple Living  2 2        2     # Outcome Date GA Lbr Len/2nd Weight Sex Delivery Anes PTL Lv  2 PAR           1 PAR                ROS: A ROS was performed and pertinent positives and negatives are included in the history.  GENERAL: No fevers or chills. HEENT: No change in vision, no earache, sore throat or sinus congestion. NECK: No pain or stiffness. CARDIOVASCULAR: No chest pain or pressure. No palpitations. PULMONARY: No shortness of breath, cough or wheeze.  GASTROINTESTINAL: No abdominal pain, nausea, vomiting or diarrhea, melena or bright red blood per rectum. GENITOURINARY: No urinary frequency, urgency, hesitancy or dysuria. MUSCULOSKELETAL: No joint or muscle pain, no back pain, no recent trauma. DERMATOLOGIC: No rash, no itching, no lesions. ENDOCRINE: No polyuria, polydipsia, no heat or cold intolerance. No recent change in weight. HEMATOLOGICAL: No anemia or easy bruising or bleeding. NEUROLOGIC: No headache, seizures, numbness, tingling or weakness. PSYCHIATRIC: No depression, no loss of interest in normal activity or change in sleep pattern.     Exam: chaperone present  BP 128/88  Ht 4' 11.5" (1.511 m)  Wt 140 lb (63.504 kg)  BMI 27.81 kg/m2  Body mass index is 27.81 kg/(m^2).  General appearance : Well developed well nourished female. No acute distress HEENT: Neck supple, trachea midline, no carotid bruits, no thyroidmegaly Lungs: Clear to auscultation, no rhonchi or wheezes, or rib retractions  Heart: Regular rate and rhythm, no murmurs or gallops Breast:Examined in sitting and supine position were symmetrical in appearance, no palpable masses or tenderness,  no skin retraction, no nipple inversion, no nipple discharge, no skin discoloration, no axillary or supraclavicular lymphadenopathy Abdomen: no palpable masses or tenderness, no rebound or guarding Extremities: no edema or skin discoloration or tenderness  Pelvic:  Bartholin, Urethra, Skene Glands: Within normal limits             Vagina: No gross lesions  or discharge  Cervix: Absent  Uterus absent  Adnexa  Without masses or tenderness  Anus and perineum  normal   Rectovaginal  normal sphincter tone without palpated masses or tenderness             Hemoccult colonoscopy within a few weeks     Assessment/Plan:  63 y.o. female for annual exam who was reminded to follow up with her recommended colonoscopy. Her Pap smear was done today. She will also be scheduling a bone  density study here in our office patient will be getting her fasting blood work with her new PCP. Because of her history vitamin D deficiency in the past we went to check the vitamin D level today. Because of her late onset of hot flashes after being off of HRT we are going to check her TSH today. We discussed importance of calcium and vitamin B and regular exercise for osteoporosis prevention. She was commended on having stopped smoking. Prescription for shingles vaccine was provided as well. Pap smear was done today. For her intertrigo she was given prescription  For Mytrex cream to use when necessary.   Note: This dictation was prepared with  Dragon/digital dictation along withSmart phrase technology. Any transcriptional errors that result from this process are unintentional.   Terrance Mass MD, 4:51 PM 08/13/2013

## 2013-08-14 LAB — VITAMIN D 25 HYDROXY (VIT D DEFICIENCY, FRACTURES): VIT D 25 HYDROXY: 50 ng/mL (ref 30–89)

## 2013-08-14 LAB — TSH: TSH: 1.33 u[IU]/mL (ref 0.350–4.500)

## 2013-08-16 LAB — CYTOLOGY - PAP

## 2013-09-17 ENCOUNTER — Ambulatory Visit (INDEPENDENT_AMBULATORY_CARE_PROVIDER_SITE_OTHER): Payer: 59

## 2013-09-17 DIAGNOSIS — M949 Disorder of cartilage, unspecified: Secondary | ICD-10-CM

## 2013-09-17 DIAGNOSIS — M899 Disorder of bone, unspecified: Secondary | ICD-10-CM

## 2013-09-17 DIAGNOSIS — M8580 Other specified disorders of bone density and structure, unspecified site: Secondary | ICD-10-CM

## 2013-09-17 DIAGNOSIS — Z8639 Personal history of other endocrine, nutritional and metabolic disease: Secondary | ICD-10-CM

## 2013-12-24 ENCOUNTER — Encounter: Payer: Self-pay | Admitting: Gynecology

## 2014-02-07 ENCOUNTER — Encounter: Payer: Self-pay | Admitting: Gynecology

## 2014-02-11 ENCOUNTER — Other Ambulatory Visit: Payer: Self-pay | Admitting: Gastroenterology

## 2015-02-13 ENCOUNTER — Encounter: Payer: Self-pay | Admitting: Gynecology

## 2016-02-05 ENCOUNTER — Encounter: Payer: Self-pay | Admitting: Gynecology

## 2016-07-07 ENCOUNTER — Encounter: Payer: Self-pay | Admitting: Gynecology

## 2019-04-22 ENCOUNTER — Ambulatory Visit: Payer: Self-pay | Attending: Internal Medicine

## 2019-04-22 DIAGNOSIS — Z23 Encounter for immunization: Secondary | ICD-10-CM | POA: Insufficient documentation

## 2019-04-22 NOTE — Progress Notes (Signed)
   Covid-19 Vaccination Clinic  Name:  Kelsey [name]    MRN: [id number] DOB: 1951-01-23  04/22/2019  Ms. Kelsey [name] was observed post Covid-19 immunization for 15 minutes without incidence. She was provided with Vaccine Information Sheet and instruction to access the V-Safe system.   Ms. Kelsey [name] was instructed to call 911 with any severe reactions post vaccine: Marland Kitchen Difficulty breathing  . Swelling of your face and throat  . A fast heartbeat  . A bad rash all over your body  . Dizziness and weakness    Immunizations Administered    Name Date Dose VIS Date Route   Pfizer COVID-19 Vaccine 04/22/2019  3:27 PM 0.3 mL 02/02/2019 Intramuscular   Manufacturer: [place]   Lot: [id number]   [place]: [id number]

## 2019-05-23 ENCOUNTER — Ambulatory Visit: Payer: Self-pay | Attending: Internal Medicine

## 2019-05-23 DIAGNOSIS — Z23 Encounter for immunization: Secondary | ICD-10-CM

## 2019-05-23 NOTE — Progress Notes (Signed)
   Covid-19 Vaccination Clinic  Name:  Kelsey [name]    MRN: [id number] DOB: [birth year]  05/23/2019  [name] was observed post Covid-19 immunization for 15 minutes without incident. She was provided with Vaccine Information Sheet and instruction to access the V-Safe system.   [name] was instructed to call 911 with any severe reactions post vaccine: Marland Kitchen Difficulty breathing  . Swelling of face and throat  . A fast heartbeat  . A bad rash all over body  . Dizziness and weakness   Immunizations Administered    Name Date Dose VIS Date Route   Pfizer COVID-19 Vaccine 05/23/2019  4:51 PM 0.3 mL 02/02/2019 Intramuscular   Manufacturer: [place]   Lot: [id number]   [place]: [id number]

## 2021-02-01 ENCOUNTER — Emergency Department (HOSPITAL_COMMUNITY)
Admission: EM | Admit: 2021-02-01 | Discharge: 2021-02-01 | Disposition: A | Discharge status: Home / Self Care | Payer: Medicare Other | Attending: Emergency Medicine | Admitting: Emergency Medicine

## 2021-02-01 ENCOUNTER — Encounter (HOSPITAL_COMMUNITY): Payer: Self-pay

## 2021-02-01 ENCOUNTER — Other Ambulatory Visit: Payer: Self-pay

## 2021-02-01 DIAGNOSIS — B029 Zoster without complications: Secondary | ICD-10-CM | POA: Insufficient documentation

## 2021-02-01 DIAGNOSIS — Z87891 Personal history of nicotine dependence: Secondary | ICD-10-CM | POA: Diagnosis not present

## 2021-02-01 DIAGNOSIS — E119 Type 2 diabetes mellitus without complications: Secondary | ICD-10-CM | POA: Diagnosis not present

## 2021-02-01 DIAGNOSIS — M79605 Pain in left leg: Secondary | ICD-10-CM | POA: Insufficient documentation

## 2021-02-01 DIAGNOSIS — R21 Rash and other nonspecific skin eruption: Secondary | ICD-10-CM | POA: Diagnosis present

## 2021-02-01 DIAGNOSIS — Z86018 Personal history of other benign neoplasm: Secondary | ICD-10-CM | POA: Diagnosis not present

## 2021-02-01 DIAGNOSIS — I10 Essential (primary) hypertension: Secondary | ICD-10-CM | POA: Diagnosis not present

## 2021-02-01 HISTORY — DX: Type 2 diabetes mellitus without complications: E11.9

## 2021-02-01 MED ORDER — OXYCODONE HCL 5 MG PO TABS: 5.0000 mg | ORAL_TABLET | Freq: Four times a day (QID) | ORAL | 0 refills | Status: AC | PRN

## 2021-02-01 MED ORDER — VALACYCLOVIR HCL 1 G PO TABS: 1000.0000 mg | ORAL_TABLET | Freq: Three times a day (TID) | ORAL | 0 refills | Status: AC

## 2021-02-01 MED ORDER — OXYCODONE HCL 5 MG PO TABS: 5.0000 mg | ORAL_TABLET | Freq: Four times a day (QID) | ORAL | 0 refills | Status: DC | PRN

## 2021-02-01 MED ORDER — OXYCODONE HCL 5 MG PO TABS
5.0000 mg | ORAL_TABLET | Freq: Once | ORAL | Status: AC
  Administered 2021-02-01: 5 mg via ORAL
  Filled 2021-02-01: qty 1

## 2021-02-01 MED ORDER — VALACYCLOVIR HCL 1 G PO TABS: 1000.0000 mg | ORAL_TABLET | Freq: Three times a day (TID) | ORAL | 0 refills | Status: DC

## 2021-02-01 NOTE — ED Triage Notes (Signed)
Pt c/o L lower back pain radiating down her L leg for five days. Pt seen at [facility] for same, but now rash has developed in the same area.

## 2021-02-01 NOTE — Discharge Instructions (Addendum)
1. Medications: valacyclovir, roxicodone for severe pain, usual home medications 2. Treatment: rest, drink plenty of fluids, keep areas clean with warm soap and water 3. Follow Up: Please followup with your primary doctor in 2 days for discussion of your diagnoses and further evaluation after today's visit; if you do not have a primary care doctor use the resource guide provided to find one; Please return to the ER for new or worsening symptoms

## 2021-02-01 NOTE — ED Provider Notes (Signed)
[facility]PART[facility] Provider Note   CSN: [id number] Arrival date & time: 02/01/21  0259     History Chief Complaint  Patient presents with   Back Pain   Rash    Kelsey [name] is a 70 y.o. female presents with left leg pain ongoing for the last 4 days.  Pt reports a hx of right sided low back pain which has improved with PT.  Pt reports this developed over 24 hours. She was seen at [facility] on 12/6 and dx with sciatica.  She was given muscle relaxer which has not helped. She reports developing a rash 3 days later.  Pt was seen at [facility] on 12/9 and diagnosed with urticaria. She was given decadron, kenalog and benadryl.  Pt reports the rash is not widespread and is only along the back of the left leg.  She does not believe this is an allergic reaction.  Pt reports the rash is red, painful and oozing.  Touch and clothing makes the pain worse.  Nothing makes it better.  Pt denies hx of shingles.   The history is provided by the patient, medical records and a relative. No language interpreter was used.      Past Medical History:  Diagnosis Date   Anemia    Arthritis    Colon polyp 07/24/2003   BENIGN   Depression    Diabetes mellitus without complication (HCC)    Elevated blood-pressure reading without diagnosis of hypertension    Endometriosis    Fibroid    Hypertension    Osteopenia    Rash    Reflux    SOB (shortness of breath)    Sore throat    Vitamin D deficiency    Wears dentures    Wears glasses     Patient Active Problem List   Diagnosis Date Noted   Anxiety 06/23/2012   Intertrigo 06/23/2012   Osteopenia 06/11/2011   Hip discomfort 06/11/2011   Depression 06/11/2011   Lipoma 09/04/2010   Pain, upper back 09/04/2010    Past Surgical History:  Procedure Laterality Date   ABDOMINAL HYSTERECTOMY     and oophorectomy---Dr Toney Rakes   ABDOMINAL HYSTERECTOMY     CESAREAN SECTION     X2   ENDOMETRIAL ABLATION       OB History      Gravida  2   Para  2   Term      Preterm      AB      Living  2      SAB      IAB      Ectopic      Multiple      Live Births              Family History  Problem Relation Age of Onset   Diabetes Mother    Barrett's esophagus Mother    Hypertension Father    Emphysema Father    Osteoporosis Father    Esophageal cancer Other        MGF, 2 uncles    Colon cancer Neg Hx    Breast cancer Neg Hx    Heart disease Sister     Social History   Tobacco Use   Smoking status: Former    Packs/day: 0.25    Types: Cigarettes    Quit date: 01/13/2013    Years since quitting: 8.0   Smokeless tobacco: Never  Substance Use Topics   Alcohol use: Yes  Comment: rarely   Drug use: No    Home Medications Prior to Admission medications   Medication Sig Start Date End Date Taking? Authorizing Provider  Multiple Vitamins-Minerals (CENTRUM SILVER ULTRA WOMENS PO) Take by mouth daily.      [provider]  nystatin-triamcinolone (MYCOLOG II) cream Apply 1 application topically 3 (three) times daily. 08/13/13   Terrance Mass, MD  oxyCODONE (ROXICODONE) 5 MG immediate release tablet Take 1 tablet (5 mg total) by mouth every 6 (six) hours as needed for severe pain. 02/01/21   Emagene Merfeld, Jarrett Soho, PA-C  PARoxetine (PAXIL) 20 MG tablet Take 0.5 tablets (10 mg total) by mouth every morning. 06/23/12 06/23/13  Terrance Mass, MD  valACYclovir (VALTREX) 1000 MG tablet Take 1 tablet (1,000 mg total) by mouth 3 (three) times daily. 02/01/21   Aadvik Roker, Jarrett Soho, PA-C    Allergies    Promethazine hcl and Other  Review of Systems   Review of Systems  Constitutional:  Negative for appetite change, diaphoresis, fatigue, fever and unexpected weight change.  HENT:  Negative for mouth sores.   Eyes:  Negative for visual disturbance.  Respiratory:  Negative for cough, chest tightness, shortness of breath and wheezing.   Cardiovascular:  Negative for chest pain.   Gastrointestinal:  Negative for abdominal pain, constipation, diarrhea, nausea and vomiting.  Endocrine: Negative for polydipsia, polyphagia and polyuria.  Genitourinary:  Negative for dysuria, frequency, hematuria and urgency.  Musculoskeletal:  Positive for back pain. Negative for neck stiffness.  Skin:  Positive for rash.  Allergic/Immunologic: Negative for immunocompromised state.  Neurological:  Negative for syncope, light-headedness and headaches.  Hematological:  Does not bruise/bleed easily.  Psychiatric/Behavioral:  Negative for sleep disturbance. The patient is not nervous/anxious.    Physical Exam Updated Vital Signs BP (!) 154/91 (BP Location: Right Arm)   Pulse 69   Temp 97.8 F (36.6 C) (Oral)   Resp 16   Ht 5\' 1"  (1.549 m)   Wt 57.6 kg   SpO2 99%   BMI 24.00 kg/m   Physical Exam Vitals and nursing note reviewed.  Constitutional:      General: She is not in acute distress.    Appearance: She is well-developed. She is not ill-appearing.  HENT:     Head: Normocephalic.  Eyes:     General: No scleral icterus.    Conjunctiva/sclera: Conjunctivae normal.  Cardiovascular:     Rate and Rhythm: Normal rate.  Pulmonary:     Effort: Pulmonary effort is normal.  Abdominal:     General: There is no distension.  Musculoskeletal:        General: Normal range of motion.     Cervical back: Normal range of motion.     Thoracic back: Normal.     Lumbar back: Normal.  Skin:    General: Skin is warm and dry.     Comments: Erythematous, vesicular rash noted to the L5-S1 dermatome of the left buttocks and posterior leg. No induration or purulent drainage.    Neurological:     Mental Status: She is alert.     Comments: Ambulatory with steady gait; no foot drop  Psychiatric:        Mood and Affect: Mood normal.    ED Results / Procedures / Treatments     Procedures Procedures   Medications Ordered in ED Medications  oxyCODONE (Oxy IR/ROXICODONE) immediate release  tablet 5 mg (has no administration in time range)    ED Course  I have reviewed  the triage vital signs and the nursing notes.  Pertinent labs & imaging results that were available during my care of the patient were reviewed by me and considered in my medical decision making (see chart for details).    MDM Rules/Calculators/A&P                           Pt presents with left leg pain.  Denies back pain.  Recent diagnosis of sciatica. Vesicular rash in the L5/S1 dermatome. This rash is consistent with shingles.  Clinically pain does not correlate with sciatica.  Will give Valacyclovir and pain control.  Discussed home care, close PCP follow-up and reasons to return to the ED. Pt and daughter state understanding and is in agreement with the plan.    Final Clinical Impression(s) / ED Diagnoses Final diagnoses:  Herpes zoster without complication    Rx / DC Orders ED Discharge Orders          Ordered    valACYclovir (VALTREX) 1000 MG tablet  3 times daily,   Status:  Discontinued        02/01/21 0350    oxyCODONE (ROXICODONE) 5 MG immediate release tablet  Every 6 hours PRN,   Status:  Discontinued        02/01/21 0350    valACYclovir (VALTREX) 1000 MG tablet  3 times daily        02/01/21 0351    oxyCODONE (ROXICODONE) 5 MG immediate release tablet  Every 6 hours PRN        02/01/21 0351             Keiondre Colee, Jarrett Soho, PA-C 02/01/21 0402    Cardama, Grayce Sessions, MD 02/01/21 [phone]
